# Patient Record
Sex: Male | Born: 1935 | Race: White | Hispanic: No | Marital: Married | Smoking: Current some day smoker
Health system: Southern US, Community
[De-identification: ages and names within clinical notes are randomized; demographics above are authoritative.]

## PROBLEM LIST (undated history)

## (undated) DIAGNOSIS — I10 Essential (primary) hypertension: Secondary | ICD-10-CM

## (undated) DIAGNOSIS — Z9289 Personal history of other medical treatment: Secondary | ICD-10-CM

## (undated) DIAGNOSIS — Z950 Presence of cardiac pacemaker: Secondary | ICD-10-CM

## (undated) DIAGNOSIS — I4891 Unspecified atrial fibrillation: Secondary | ICD-10-CM

## (undated) DIAGNOSIS — E785 Hyperlipidemia, unspecified: Secondary | ICD-10-CM

## (undated) DIAGNOSIS — I4430 Unspecified atrioventricular block: Secondary | ICD-10-CM

## (undated) DIAGNOSIS — B019 Varicella without complication: Secondary | ICD-10-CM

## (undated) DIAGNOSIS — C801 Malignant (primary) neoplasm, unspecified: Secondary | ICD-10-CM

## (undated) HISTORY — DX: Varicella without complication: B01.9

## (undated) HISTORY — DX: Hyperlipidemia, unspecified: E78.5

## (undated) HISTORY — DX: Essential (primary) hypertension: I10

## (undated) HISTORY — PX: HERNIA REPAIR: SHX51

## (undated) HISTORY — DX: Personal history of other medical treatment: Z92.89

## (undated) HISTORY — DX: Unspecified atrioventricular block: I44.30

## (undated) HISTORY — DX: Unspecified atrial fibrillation: I48.91

## (undated) HISTORY — PX: EYE SURGERY: SHX253

---

## 2001-05-08 ENCOUNTER — Encounter: Payer: Self-pay | Admitting: General Surgery

## 2001-05-15 ENCOUNTER — Inpatient Hospital Stay (HOSPITAL_COMMUNITY): Admission: RE | Admit: 2001-05-15 | Discharge: 2001-05-18 | Payer: Self-pay | Admitting: General Surgery

## 2005-05-05 ENCOUNTER — Ambulatory Visit: Payer: Self-pay | Admitting: Radiation Oncology

## 2005-05-24 ENCOUNTER — Ambulatory Visit: Payer: Self-pay | Admitting: Radiation Oncology

## 2005-08-21 ENCOUNTER — Emergency Department: Payer: Self-pay | Admitting: Emergency Medicine

## 2006-05-04 ENCOUNTER — Ambulatory Visit: Payer: Self-pay | Admitting: Radiation Oncology

## 2006-05-24 ENCOUNTER — Ambulatory Visit: Payer: Self-pay | Admitting: Radiation Oncology

## 2006-10-24 HISTORY — PX: PROSTATECTOMY: SHX69

## 2007-03-20 ENCOUNTER — Ambulatory Visit: Payer: Self-pay | Admitting: Gastroenterology

## 2007-05-25 ENCOUNTER — Ambulatory Visit: Payer: Self-pay | Admitting: Radiation Oncology

## 2007-06-07 ENCOUNTER — Ambulatory Visit: Payer: Self-pay | Admitting: Radiation Oncology

## 2007-06-25 ENCOUNTER — Ambulatory Visit: Payer: Self-pay | Admitting: Radiation Oncology

## 2008-05-24 ENCOUNTER — Ambulatory Visit: Payer: Self-pay | Admitting: Radiation Oncology

## 2008-06-02 ENCOUNTER — Ambulatory Visit: Payer: Self-pay | Admitting: Radiation Oncology

## 2008-06-24 ENCOUNTER — Ambulatory Visit: Payer: Self-pay | Admitting: Radiation Oncology

## 2008-10-24 HISTORY — PX: PACEMAKER PLACEMENT: SHX43

## 2008-11-24 DIAGNOSIS — C801 Malignant (primary) neoplasm, unspecified: Secondary | ICD-10-CM

## 2008-11-24 HISTORY — DX: Malignant (primary) neoplasm, unspecified: C80.1

## 2009-05-24 ENCOUNTER — Ambulatory Visit: Payer: Self-pay | Admitting: Radiation Oncology

## 2009-06-02 ENCOUNTER — Ambulatory Visit: Payer: Self-pay | Admitting: Cardiology

## 2009-06-03 ENCOUNTER — Ambulatory Visit: Payer: Self-pay | Admitting: Cardiology

## 2009-06-08 ENCOUNTER — Ambulatory Visit: Payer: Self-pay | Admitting: Radiation Oncology

## 2009-06-24 ENCOUNTER — Ambulatory Visit: Payer: Self-pay | Admitting: Radiation Oncology

## 2010-05-12 ENCOUNTER — Ambulatory Visit: Payer: Self-pay | Admitting: Gastroenterology

## 2010-05-24 ENCOUNTER — Ambulatory Visit: Payer: Self-pay | Admitting: Radiation Oncology

## 2010-06-09 ENCOUNTER — Ambulatory Visit: Payer: Self-pay | Admitting: Radiation Oncology

## 2010-06-10 LAB — PSA

## 2010-06-24 ENCOUNTER — Ambulatory Visit: Payer: Self-pay | Admitting: Radiation Oncology

## 2011-06-13 ENCOUNTER — Ambulatory Visit: Payer: Self-pay | Admitting: Radiation Oncology

## 2011-06-14 LAB — PSA: PSA: <0.1 ng/mL (ref 0.0–4.0)

## 2011-06-25 ENCOUNTER — Ambulatory Visit: Payer: Self-pay | Admitting: Radiation Oncology

## 2012-02-16 ENCOUNTER — Ambulatory Visit: Payer: Self-pay

## 2012-02-20 ENCOUNTER — Ambulatory Visit: Payer: Self-pay

## 2012-04-16 DIAGNOSIS — C61 Malignant neoplasm of prostate: Secondary | ICD-10-CM | POA: Insufficient documentation

## 2012-09-14 ENCOUNTER — Ambulatory Visit: Payer: Self-pay | Admitting: Specialist

## 2013-05-20 ENCOUNTER — Ambulatory Visit: Payer: Self-pay | Admitting: Family Medicine

## 2013-06-09 ENCOUNTER — Emergency Department: Payer: Self-pay | Admitting: Emergency Medicine

## 2013-06-09 LAB — URINALYSIS, COMPLETE
Bacteria: NONE SEEN
Bilirubin,UR: NEGATIVE
Blood: NEGATIVE
Glucose,UR: NEGATIVE mg/dL (ref 0–75)
Ketone: NEGATIVE
Leukocyte Esterase: NEGATIVE
Nitrite: NEGATIVE
Ph: 7 (ref 4.5–8.0)
Protein: NEGATIVE
RBC,UR: NONE SEEN /HPF (ref 0–5)
Specific Gravity: 1.016 (ref 1.003–1.030)
Squamous Epithelial: NONE SEEN
WBC UR: NONE SEEN /HPF (ref 0–5)

## 2013-06-09 LAB — COMPREHENSIVE METABOLIC PANEL
Albumin: 4.1 g/dL (ref 3.4–5.0)
Alkaline Phosphatase: 65 U/L (ref 50–136)
Anion Gap: 4 — ABNORMAL LOW (ref 7–16)
BUN: 14 mg/dL (ref 7–18)
Bilirubin,Total: 0.7 mg/dL (ref 0.2–1.0)
Calcium, Total: 9.4 mg/dL (ref 8.5–10.1)
Chloride: 98 mmol/L (ref 98–107)
Co2: 24 mmol/L (ref 21–32)
Creatinine: 1 mg/dL (ref 0.60–1.30)
EGFR (African American): >60
EGFR (Non-African Amer.): >60
Glucose: 97 mg/dL (ref 65–99)
Osmolality: 254 (ref 275–301)
Potassium: 4.9 mmol/L (ref 3.5–5.1)
SGOT(AST): 25 U/L (ref 15–37)
SGPT (ALT): 28 U/L (ref 12–78)
Sodium: 126 mmol/L — ABNORMAL LOW (ref 136–145)
Total Protein: 7.9 g/dL (ref 6.4–8.2)

## 2013-06-09 LAB — CBC
HCT: 41.4 % (ref 40.0–52.0)
HGB: 14.5 g/dL (ref 13.0–18.0)
MCH: 31.7 pg (ref 26.0–34.0)
MCHC: 35 g/dL (ref 32.0–36.0)
MCV: 91 fL (ref 80–100)
Platelet: 329 10*3/uL (ref 150–440)
RBC: 4.56 10*6/uL (ref 4.40–5.90)
RDW: 14 % (ref 11.5–14.5)
WBC: 10.5 10*3/uL (ref 3.8–10.6)

## 2013-11-11 ENCOUNTER — Ambulatory Visit: Payer: Self-pay | Admitting: Family Medicine

## 2014-02-10 ENCOUNTER — Ambulatory Visit: Payer: Self-pay | Admitting: Gastroenterology

## 2014-06-02 ENCOUNTER — Ambulatory Visit: Payer: Self-pay | Admitting: General Practice

## 2014-06-17 ENCOUNTER — Ambulatory Visit: Payer: Self-pay | Admitting: General Practice

## 2014-06-17 LAB — CBC WITH DIFFERENTIAL/PLATELET
Basophil #: 0.1 10*3/uL (ref 0.0–0.1)
Basophil %: 1.1 %
Eosinophil #: 0.7 10*3/uL (ref 0.0–0.7)
Eosinophil %: 6.9 %
HCT: 43.6 % (ref 40.0–52.0)
HGB: 14.7 g/dL (ref 13.0–18.0)
Lymphocyte #: 2.4 10*3/uL (ref 1.0–3.6)
Lymphocyte %: 22.3 %
MCH: 32 pg (ref 26.0–34.0)
MCHC: 33.6 g/dL (ref 32.0–36.0)
MCV: 95 fL (ref 80–100)
Monocyte #: 1 x10 3/mm (ref 0.2–1.0)
Monocyte %: 9.5 %
Neutrophil #: 6.4 10*3/uL (ref 1.4–6.5)
Neutrophil %: 60.2 %
Platelet: 270 10*3/uL (ref 150–440)
RBC: 4.59 10*6/uL (ref 4.40–5.90)
RDW: 13.5 % (ref 11.5–14.5)
WBC: 10.7 10*3/uL — ABNORMAL HIGH (ref 3.8–10.6)

## 2014-07-02 ENCOUNTER — Ambulatory Visit: Payer: Self-pay | Admitting: General Practice

## 2014-10-06 DIAGNOSIS — I340 Nonrheumatic mitral (valve) insufficiency: Secondary | ICD-10-CM | POA: Insufficient documentation

## 2014-10-13 DIAGNOSIS — Z8546 Personal history of malignant neoplasm of prostate: Secondary | ICD-10-CM | POA: Insufficient documentation

## 2014-10-13 DIAGNOSIS — I480 Paroxysmal atrial fibrillation: Secondary | ICD-10-CM | POA: Insufficient documentation

## 2014-10-24 HISTORY — PX: KNEE ARTHROSCOPY: SUR90

## 2014-11-03 DIAGNOSIS — Z85828 Personal history of other malignant neoplasm of skin: Secondary | ICD-10-CM | POA: Insufficient documentation

## 2015-02-14 NOTE — Op Note (Signed)
PATIENT NAME:  Gary [name], Gary [name] MR#:  [id number] DATE OF BIRTH:  Aug 05, 1930  DATE OF PROCEDURE:  07/02/2014  PREOPERATIVE DIAGNOSIS:  Internal derangement of the left knee.   POSTOPERATIVE DIAGNOSES:  1.  Tear of the anterior horn of the medial meniscus, left knee.  2.  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   PROCEDURES PERFORMED:  Left knee arthroscopy, partial medial meniscectomy and chondroplasty of the patellofemoral articulation.   SURGEON: Laurice Record. Hooten, MD.  ANESTHESIA: General.   ESTIMATED BLOOD LOSS: Minimal.   DRAINS: None.   TOURNIQUET TIME: Not used.   INDICATIONS FOR SURGERY: The patient is an 81 year old male who has been seen for complaints of left knee pain with occasional swelling. MRI cannot be obtained due to the patient's pacemaker. X-rays actually demonstrated a reasonably good articular space. After discussion of the risks and benefits of surgical intervention, the patient expressed understanding of the risks and benefits, and agreed with plans for surgical intervention.   PROCEDURE IN DETAIL:  The patient was brought into the operating room and after adequate general anesthesia was achieved a tourniquet was placed on the patient's left thigh and the leg was placed in a leg holder. All bony prominences were well padded. The patient's left knee and leg were cleaned and prepped with alcohol and DuraPrep and draped in the usual sterile fashion. A "timeout" was performed as per usual protocol. The anticipated portal sites were injected with 0.25% Marcaine with epinephrine. An anterolateral portal was created and a cannula was inserted. The scope was inserted and the knee was distended with fluid using the pump. The scope was advanced down the medial gutter into the medial compartment of the knee. Under visualization with the scope, an anteromedial portal was created and hook probe was inserted.   Inspection of the articular cartilage showed the  surface to be in good condition without significant degenerative changes. The posterior horn of the medial meniscus was visualized and only mild fraying was noted along the inner aspect. These areas were debrided using the ArthroCare wand. Inspection of the anterior horn did demonstrate an area with a radial tear. The area of the tear was debrided using a 4.5-mm shaver with contouring performed using the ArthroCare wand. The remaining rim was visualized and probed and felt to be stable.   The scope was then positioned so as to visualize the ACL.  The ACL was probed and felt to be stable. The scope was removed from the anterolateral portal and reinserted via the anteromedial portal so as to better visualize the lateral compartment. There was some mild fraying along the innermost aspect of the meniscus and these areas were debrided using the 50-degree ArthroCare wand. The remaining portion of the meniscus was visualized and probed and felt to be stable.   The articular surface was in good condition. Finally, the scope was positioned so as to visualize the patellofemoral articulation. Good patellar tracking was noted. There was softening and fraying of the articular surface within the intercondylar groove consistent with grade 3 changes of chondromalacia. These areas were debrided and contoured using the 50-degree ArthroCare wand.   The knee was then irrigated with copious amounts of fluid and suctioned dry. The anterolateral portal was reapproximated using 3-0 nylon. A combination of 0.25% Marcaine with epinephrine and 4 mg of morphine was injected via the scope. The scope was removed and the anteromedial portal was reapproximated using 3-0 nylon. A sterile dressing was applied followed by application of ice wrap.  The patient tolerated the procedure well. He was transported to the recovery room in stable condition.    ____________________________ Laurice Record. Holley Bouche., MD jph:lt D: 07/03/2014 35:24:81  ET T: 07/03/2014 07:06:17 ET JOB#: [id number]  cc: Laurice Record. Holley Bouche., MD, <Dictator> JAMES P Holley Bouche MD ELECTRONICALLY SIGNED 07/03/2014 20:41

## 2015-04-08 ENCOUNTER — Ambulatory Visit (INDEPENDENT_AMBULATORY_CARE_PROVIDER_SITE_OTHER): Payer: Medicare Other | Admitting: Family Medicine

## 2015-04-08 ENCOUNTER — Encounter: Payer: Self-pay | Admitting: Family Medicine

## 2015-04-08 VITALS — BP 140/80 | HR 79 | Resp 16 | Ht 73.0 in | Wt 232.0 lb

## 2015-04-08 DIAGNOSIS — I480 Paroxysmal atrial fibrillation: Secondary | ICD-10-CM | POA: Insufficient documentation

## 2015-04-08 DIAGNOSIS — L309 Dermatitis, unspecified: Secondary | ICD-10-CM

## 2015-04-08 DIAGNOSIS — B372 Candidiasis of skin and nail: Secondary | ICD-10-CM | POA: Insufficient documentation

## 2015-04-08 DIAGNOSIS — I10 Essential (primary) hypertension: Secondary | ICD-10-CM | POA: Insufficient documentation

## 2015-04-08 NOTE — Progress Notes (Signed)
Name: Gary [name]   MRN: [id number]    DOB: July 05, 1930   Date:04/08/2015       Progress Note  Subjective  Chief Complaint  Chief Complaint  Patient presents with  . Follow-up    2 month follow up   . Rash    Patient reports that he has had rash for over a 1 year and been to 4 dermatologist.     Rash This is a chronic problem. The current episode started more than 1 year ago (present for over 2 years and he has seen multiple dermatologists without any relief of symptoms). The problem is unchanged. The affected locations include the groin. The rash is characterized by burning, itchiness and scaling. Associated symptoms include fatigue and joint pain (has arthritis of joints.). Pertinent negatives include no fever or vomiting. Treatments tried: has tried both OTC and Rx creams in the past with no relief. The treatment provided no relief.      Past Medical History  Diagnosis Date  . Hypertension   . Hyperlipidemia     Past Surgical History  Procedure Laterality Date  . Prostatectomy    . Hernia repair      Family History  Problem Relation Age of Onset  . Cancer Father   . Heart disease Father   . Cancer Brother     History   Social History  . Marital Status: Married    Spouse Name: N/A  . Number of Children: N/A  . Years of Education: N/A   Occupational History  . Not on file.   Social History Main Topics  . Smoking status: Current Some Day Smoker -- 0.25 packs/day for 40 years    Types: Cigarettes  . Smokeless tobacco: Not on file  . Alcohol Use: Yes  . Drug Use: No  . Sexual Activity: Not on file   Other Topics Concern  . Not on file   Social History Narrative  . No narrative on file     Current outpatient prescriptions:  .  apixaban (ELIQUIS) 2.5 MG TABS tablet, Take 1 tablet by mouth daily., Disp: , Rfl:  .  fenofibrate 160 MG tablet, Take 1 tablet by mouth daily., Disp: , Rfl:  .  metoprolol succinate (TOPROL-XL) 25 MG 24 hr tablet, Take 1 tablet  by mouth daily., Disp: , Rfl:  .  simvastatin (ZOCOR) 20 MG tablet, Take 1 tablet by mouth daily., Disp: , Rfl:   No Known Allergies   Review of Systems  Constitutional: Positive for fatigue. Negative for fever.  Gastrointestinal: Negative for vomiting.  Musculoskeletal: Positive for joint pain (has arthritis of joints.).  Skin: Positive for itching and rash.      Objective  Filed Vitals:   04/08/15 1446  BP: 140/80  Pulse: 79  Resp: 16  Height: '6\' 1"'$  (1.854 m)  Weight: 232 lb (105.235 kg)  SpO2: 95%    Physical Exam  Constitutional: He is well-developed, well-nourished, and in no distress.  Skin:     Mildly erythematous, macular, pruritic rash over the left and right groin  Nursing note and vitals reviewed.      No results found for this or any previous visit (from the past 2160 hour(s)).   Assessment & Plan 1. Chronic dermatitis In terms consistent with chronic dermatitis. I have recommended Aveeno hydrocortisone anti-itch cream to be applied twice daily for 7 days. Patient is to allow the skin to air dry after application. If rash fails to improve, he will need  a referral to dermatology. Patient verbalized understanding with the plan.  There are no diagnoses linked to this encounter.  Gary [name] Asad A. [facility] 04/08/2015 2:55 PM

## 2015-04-13 ENCOUNTER — Ambulatory Visit: Payer: Self-pay | Admitting: Family Medicine

## 2015-05-08 ENCOUNTER — Encounter: Payer: Self-pay | Admitting: Family Medicine

## 2015-05-08 ENCOUNTER — Ambulatory Visit (INDEPENDENT_AMBULATORY_CARE_PROVIDER_SITE_OTHER): Payer: Medicare Other | Admitting: Family Medicine

## 2015-05-08 VITALS — BP 144/70 | HR 78 | Temp 98.7°F | Resp 19 | Ht 73.0 in | Wt 229.8 lb

## 2015-05-08 DIAGNOSIS — I459 Conduction disorder, unspecified: Secondary | ICD-10-CM | POA: Insufficient documentation

## 2015-05-08 DIAGNOSIS — L309 Dermatitis, unspecified: Secondary | ICD-10-CM

## 2015-05-08 DIAGNOSIS — Z8546 Personal history of malignant neoplasm of prostate: Secondary | ICD-10-CM | POA: Insufficient documentation

## 2015-05-08 DIAGNOSIS — R300 Dysuria: Secondary | ICD-10-CM | POA: Diagnosis not present

## 2015-05-08 NOTE — Progress Notes (Signed)
Name: Gary [name]   MRN: [id number]    DOB: Aug 10, 1930   Date:05/08/2015       Progress Note  Subjective  Chief Complaint  Chief Complaint  Patient presents with  . Follow-up    3 mo. rash in groin/ patient thinks he may have urinary infection    Dysuria  This is a recurrent problem. The current episode started 1 to 4 weeks ago. The problem occurs intermittently. The quality of the pain is described as burning. Associated symptoms include frequency and urgency. Pertinent negatives include no chills, flank pain, hematuria, nausea, sweats or vomiting. He has tried nothing for the symptoms. His past medical history is significant for a urological procedure (surgery for removal of prostate over 10 years ago.). There is no history of kidney stones or recurrent UTIs.  Groin Rash Patient is here for follow-up of a chronic rash in his groin area. It has been present for a long time and has been seen by multiple dermatologists. According to patient, the dermatologists have prescribed different creams but none have been able to relieve the rash. He was asked to use Aveeno hydrocortisone anti-itch cream at his appointment last month.   Past Medical History  Diagnosis Date  . Hypertension   . Hyperlipidemia     Past Surgical History  Procedure Laterality Date  . Prostatectomy    . Hernia repair      Family History  Problem Relation Age of Onset  . Cancer Father   . Heart disease Father   . Cancer Brother     History   Social History  . Marital Status: Married    Spouse Name: N/A  . Number of Children: N/A  . Years of Education: N/A   Occupational History  . Not on file.   Social History Main Topics  . Smoking status: Current Some Day Smoker -- 0.25 packs/day for 40 years    Types: Cigarettes  . Smokeless tobacco: Not on file  . Alcohol Use: Yes  . Drug Use: No  . Sexual Activity: Not on file   Other Topics Concern  . Not on file   Social History Narrative      Current outpatient prescriptions:  .  apixaban (ELIQUIS) 2.5 MG TABS tablet, Take 1 tablet by mouth daily., Disp: , Rfl:  .  fenofibrate 160 MG tablet, Take 1 tablet by mouth daily., Disp: , Rfl:  .  metoprolol succinate (TOPROL-XL) 25 MG 24 hr tablet, Take 1 tablet by mouth daily., Disp: , Rfl:  .  simvastatin (ZOCOR) 20 MG tablet, Take 1 tablet by mouth daily., Disp: , Rfl:   No Known Allergies   Review of Systems  Constitutional: Negative for fever and chills.  Gastrointestinal: Negative for nausea and vomiting.  Genitourinary: Positive for dysuria, urgency and frequency. Negative for hematuria and flank pain.  Skin: Positive for rash. Negative for itching.      Objective  Filed Vitals:   05/08/15 1131  BP: 144/70  Pulse: 78  Temp: 98.7 F (37.1 C)  TempSrc: Oral  Resp: 19  Height: '6\' 1"'$  (1.854 m)  Weight: 229 lb 12.8 oz (104.237 kg)  SpO2: 96%    Physical Exam  Constitutional: He is oriented to person, place, and time and well-developed, well-nourished, and in no distress.  HENT:  Head: Normocephalic and atraumatic.  Cardiovascular: Normal rate and regular rhythm.   Pulmonary/Chest: Effort normal and breath sounds normal.  Abdominal: Soft. Bowel sounds are normal.  Neurological: He is  alert and oriented to person, place, and time.  Skin: Skin is warm and dry. No rash noted. No erythema.  Nursing note and vitals reviewed.    Assessment & Plan 1. Dysuria We will obtain urinalysis and lab work for evaluation of symptoms of dysuria. Follow-up after review of lab results. - Urinalysis, Routine w reflex microscopic - Comprehensive Metabolic Panel (CMET) - CBC with Differential  2. Chronic dermatitis The rash in his groin appears to have resolved. Patient was reassured. He may use Aveeno hydrocortisone anti-itch cream as needed.   Gary [name] Asad A. [facility] 05/08/2015 6:26 PM

## 2015-05-09 LAB — URINALYSIS, ROUTINE W REFLEX MICROSCOPIC
BILIRUBIN UA: NEGATIVE
Glucose, UA: NEGATIVE
Ketones, UA: NEGATIVE
LEUKOCYTES UA: NEGATIVE
Nitrite, UA: NEGATIVE
Protein, UA: NEGATIVE
RBC, UA: NEGATIVE
SPEC GRAV UA: 1.018 (ref 1.005–1.030)
Urobilinogen, Ur: 1 mg/dL (ref 0.2–1.0)
pH, UA: 6.5 (ref 5.0–7.5)

## 2015-05-09 LAB — CBC WITH DIFFERENTIAL/PLATELET
BASOS ABS: 0.1 10*3/uL (ref 0.0–0.2)
Basos: 1 %
EOS (ABSOLUTE): 0.8 10*3/uL — ABNORMAL HIGH (ref 0.0–0.4)
EOS: 7 %
Hematocrit: 46.2 % (ref 37.5–51.0)
Hemoglobin: 15.7 g/dL (ref 12.6–17.7)
IMMATURE GRANS (ABS): 0 10*3/uL (ref 0.0–0.1)
IMMATURE GRANULOCYTES: 0 %
LYMPHS: 26 %
Lymphocytes Absolute: 2.8 10*3/uL (ref 0.7–3.1)
MCH: 31.6 pg (ref 26.6–33.0)
MCHC: 34 g/dL (ref 31.5–35.7)
MCV: 93 fL (ref 79–97)
MONOCYTES: 8 %
Monocytes Absolute: 0.9 10*3/uL (ref 0.1–0.9)
NEUTROS PCT: 58 %
Neutrophils Absolute: 6.4 10*3/uL (ref 1.4–7.0)
PLATELETS: 287 10*3/uL (ref 150–379)
RBC: 4.97 x10E6/uL (ref 4.14–5.80)
RDW: 13.9 % (ref 12.3–15.4)
WBC: 11 10*3/uL — AB (ref 3.4–10.8)

## 2015-05-09 LAB — COMPREHENSIVE METABOLIC PANEL
ALK PHOS: 62 IU/L (ref 39–117)
ALT: 19 IU/L (ref 0–44)
AST: 21 IU/L (ref 0–40)
Albumin/Globulin Ratio: 1.6 (ref 1.1–2.5)
Albumin: 4.4 g/dL (ref 3.5–4.7)
BUN/Creatinine Ratio: 18 (ref 10–22)
BUN: 15 mg/dL (ref 8–27)
Bilirubin Total: 0.7 mg/dL (ref 0.0–1.2)
CHLORIDE: 97 mmol/L (ref 97–108)
CO2: 26 mmol/L (ref 18–29)
Calcium: 10.1 mg/dL (ref 8.6–10.2)
Creatinine, Ser: 0.84 mg/dL (ref 0.76–1.27)
GFR calc Af Amer: 94 mL/min/{1.73_m2} (ref >59)
GFR calc non Af Amer: 81 mL/min/{1.73_m2} (ref >59)
GLUCOSE: 90 mg/dL (ref 65–99)
Globulin, Total: 2.8 g/dL (ref 1.5–4.5)
POTASSIUM: 5.4 mmol/L — AB (ref 3.5–5.2)
Sodium: 136 mmol/L (ref 134–144)
TOTAL PROTEIN: 7.2 g/dL (ref 6.0–8.5)

## 2015-05-18 ENCOUNTER — Ambulatory Visit: Payer: Medicare Other

## 2015-07-23 ENCOUNTER — Encounter: Payer: Self-pay | Admitting: Family Medicine

## 2015-07-23 ENCOUNTER — Ambulatory Visit (INDEPENDENT_AMBULATORY_CARE_PROVIDER_SITE_OTHER): Payer: 59 | Admitting: Family Medicine

## 2015-07-23 VITALS — BP 124/78 | HR 64 | Temp 97.7°F | Ht 72.0 in | Wt 230.1 lb

## 2015-07-23 DIAGNOSIS — I10 Essential (primary) hypertension: Secondary | ICD-10-CM

## 2015-07-23 DIAGNOSIS — I442 Atrioventricular block, complete: Secondary | ICD-10-CM

## 2015-07-23 DIAGNOSIS — E66811 Obesity, class 1: Secondary | ICD-10-CM

## 2015-07-23 DIAGNOSIS — R0989 Other specified symptoms and signs involving the circulatory and respiratory systems: Secondary | ICD-10-CM

## 2015-07-23 DIAGNOSIS — R21 Rash and other nonspecific skin eruption: Secondary | ICD-10-CM

## 2015-07-23 DIAGNOSIS — Z720 Tobacco use: Secondary | ICD-10-CM

## 2015-07-23 DIAGNOSIS — I480 Paroxysmal atrial fibrillation: Secondary | ICD-10-CM

## 2015-07-23 DIAGNOSIS — E785 Hyperlipidemia, unspecified: Secondary | ICD-10-CM

## 2015-07-23 DIAGNOSIS — O1002 Pre-existing essential hypertension complicating childbirth: Secondary | ICD-10-CM | POA: Insufficient documentation

## 2015-07-23 DIAGNOSIS — Z23 Encounter for immunization: Secondary | ICD-10-CM

## 2015-07-23 DIAGNOSIS — E669 Obesity, unspecified: Secondary | ICD-10-CM

## 2015-07-23 DIAGNOSIS — Z418 Encounter for other procedures for purposes other than remedying health state: Secondary | ICD-10-CM

## 2015-07-23 DIAGNOSIS — Z299 Encounter for prophylactic measures, unspecified: Secondary | ICD-10-CM

## 2015-07-23 LAB — LIPID PANEL
CHOL/HDL RATIO: 3
Cholesterol: 177 mg/dL (ref 0–200)
HDL: 51.3 mg/dL (ref >39.00)
LDL Cholesterol: 97 mg/dL (ref 0–99)
NonHDL: 126.14
Triglycerides: 146 mg/dL (ref 0.0–149.0)
VLDL: 29.2 mg/dL (ref 0.0–40.0)

## 2015-07-23 LAB — HEMOGLOBIN A1C: Hgb A1c MFr Bld: 5.8 % (ref 4.6–6.5)

## 2015-07-23 MED ORDER — BUPROPION HCL ER (SR) 150 MG PO TB12: ORAL_TABLET | ORAL | Status: DC

## 2015-07-23 NOTE — Progress Notes (Signed)
Pre visit review using our clinic review tool, if applicable. No additional management support is needed unless otherwise documented below in the visit note. 

## 2015-07-23 NOTE — Patient Instructions (Signed)
It was nice to see you today.  Continue your current medications.  Take the wellbutrin as prescribed.  Be sure to pick a quit date!  Follow up in ~ 3 months.  We will call with your labs and your appointment for the blood pressures in your legs.  Take care  Dr. Lacinda Axon

## 2015-07-24 ENCOUNTER — Other Ambulatory Visit: Payer: Self-pay | Admitting: Family Medicine

## 2015-07-24 ENCOUNTER — Encounter: Payer: Self-pay | Admitting: Family Medicine

## 2015-07-24 DIAGNOSIS — I442 Atrioventricular block, complete: Secondary | ICD-10-CM | POA: Insufficient documentation

## 2015-07-24 DIAGNOSIS — I10 Essential (primary) hypertension: Secondary | ICD-10-CM | POA: Insufficient documentation

## 2015-07-24 DIAGNOSIS — R21 Rash and other nonspecific skin eruption: Secondary | ICD-10-CM | POA: Insufficient documentation

## 2015-07-24 DIAGNOSIS — R0989 Other specified symptoms and signs involving the circulatory and respiratory systems: Secondary | ICD-10-CM | POA: Insufficient documentation

## 2015-07-24 DIAGNOSIS — Z720 Tobacco use: Secondary | ICD-10-CM | POA: Insufficient documentation

## 2015-07-24 MED ORDER — ATORVASTATIN CALCIUM 40 MG PO TABS: 40.0000 mg | ORAL_TABLET | Freq: Every day | ORAL | Status: DC

## 2015-07-24 NOTE — Assessment & Plan Note (Signed)
Noted on exam today. Patient at high risk for peripheral vascular disease given smoking history. Sending to vascular for ABIs.

## 2015-07-24 NOTE — Assessment & Plan Note (Signed)
Well controlled. Continue metoprolol.

## 2015-07-24 NOTE — Assessment & Plan Note (Signed)
Unclear etiology. Offered dermatology referral and he declined. Will continue to monitor closely.

## 2015-07-24 NOTE — Assessment & Plan Note (Signed)
Lipid panel was obtained and revealed LDL of 97. Will notify patient of results and discuss potential switch from simvastatin to atorvastatin.

## 2015-07-24 NOTE — Assessment & Plan Note (Signed)
Doing well on Xarelto.

## 2015-07-24 NOTE — Progress Notes (Signed)
Subjective:  Patient ID: Gary [name], male    DOB: [birth year]  Age: 81 y.o. MRN: [id number]  CC: Establish care.  HPI Gary [name] is a 81 y.o. male presents to the clinic today to establish care. Current issues are below.  1) HTN  Well controlled.  Medications - Metoprolol.  Compliance -  Yes.  ROS: Denies chest pain, SOB, lightheadedness/dizziness   2) HLD  In need of lipid panel.  Per cardiology, has been stable and well controlled.   3) A fib  Doing well on Xarelto.  4) Rash  Patient reports he has had a rash as growing for approximately 1 year.  He states that he seen several physicians including dermatologist with no resolution.  He reports the rash is mildly troublesome and itches intermittently.  No exacerbating or relieving factors.  PMH, Surgical Hx, Family Hx, Social History reviewed and updated as below. Past Medical History  Diagnosis Date  . Hypertension   . Hyperlipidemia   . Chicken pox   . History of blood transfusion   . AV block     s/p Pacemaker placement.  . Atrial fibrillation    Past Surgical History  Procedure Laterality Date  . Prostatectomy    . Hernia repair    . Knee arthroscopy    . Pacemaker placement     Family History  Problem Relation Age of Onset  . Cancer Father   . Heart disease Father   . Cancer Brother    Social History  Substance Use Topics  . Smoking status: Current Some Day Smoker -- 0.25 packs/day for 40 years    Types: Cigarettes  . Smokeless tobacco: Never Used  . Alcohol Use: 1.2 oz/week    2 Standard drinks or equivalent per week   Review of Systems  Skin: Positive for rash.       Groin rash.  All other systems negative.  Objective:   Today's Vitals: BP 124/78 mmHg  Pulse 64  Temp(Src) 97.7 F (36.5 C) (Oral)  Ht 6' (1.829 m)  Wt 230 lb 2 oz (104.384 kg)  BMI 31.20 kg/m2  SpO2 94%  Physical Exam  Constitutional: He appears well-developed and well-nourished. No distress.  HENT:    Head: Normocephalic and atraumatic.  Mouth/Throat: Oropharynx is clear and moist.  Normal TM's bilaterally.  Eyes: Conjunctivae are normal. No scleral icterus.  Neck: Neck supple. No thyromegaly present.  Cardiovascular: Normal rate and regular rhythm.   2/6 systolic murmur noted.  Decreased pulses of the lower extremities.  Pulmonary/Chest: Effort normal and breath sounds normal. No respiratory distress. He has no wheezes. He has no rales.  Abdominal: Soft. He exhibits no distension. There is no tenderness. There is no rebound and no guarding.  Lymphadenopathy:    He has no cervical adenopathy.  Neurological: He is alert.  Skin:  Petechial rash noted on the scrotum.  Psychiatric: He has a normal mood and affect.  Vitals reviewed.  Assessment & Plan:   Problem List Items Addressed This Visit    AF (paroxysmal atrial fibrillation)    Doing well on Xarelto.      Relevant Medications   aspirin 81 MG tablet   Hyperlipidemia - Primary    Lipid panel was obtained and revealed LDL of 97. Will notify patient of results and discuss potential switch from simvastatin to atorvastatin.      Relevant Medications   aspirin 81 MG tablet   Other Relevant Orders   Lipid panel (Completed)  AV block, complete   Relevant Medications   aspirin 81 MG tablet   Tobacco abuse    We discussed tobacco cessation today and he would like to quit. Starting Wellbutrin.      HTN (hypertension)    Well controlled. Continue metoprolol.      Relevant Medications   aspirin 81 MG tablet   Rash    Unclear etiology. Offered dermatology referral and he declined. Will continue to monitor closely.      Diminished pulses in lower extremity    Noted on exam today. Patient at high risk for peripheral vascular disease given smoking history. Sending to vascular for ABIs.      Relevant Orders   Ambulatory referral to Vascular Surgery    Other Visit Diagnoses    Obesity (BMI 30.0-34.9)         Relevant Orders    Hemoglobin A1c (Completed)    Need for prophylactic vaccination with combined diphtheria-tetanus-pertussis (DTP) vaccine        Relevant Orders    Tdap vaccine greater than or equal to 7yo IM (Completed)    Need for prophylactic measure        Relevant Orders    Pneumococcal conjugate vaccine 13-valent (Completed)       Outpatient Encounter Prescriptions as of 07/23/2015  Medication Sig  . apixaban (ELIQUIS) 2.5 MG TABS tablet Take 1 tablet by mouth 2 (two) times daily.   Marland Kitchen aspirin 81 MG tablet Take 81 mg by mouth daily.  . fenofibrate 160 MG tablet Take 1 tablet by mouth daily.  . metoprolol succinate (TOPROL-XL) 25 MG 24 hr tablet Take 1 tablet by mouth daily.  . simvastatin (ZOCOR) 20 MG tablet Take 1 tablet by mouth daily.  Marland Kitchen buPROPion (WELLBUTRIN SR) 150 MG 12 hr tablet 150 mg once daily for 3 days. Then increase to 150 mg twice daily.   No facility-administered encounter medications on file as of 07/23/2015.    Follow-up:   Coral Spikes DO

## 2015-07-24 NOTE — Assessment & Plan Note (Signed)
We discussed tobacco cessation today and he would like to quit. Starting Wellbutrin.

## 2015-10-29 ENCOUNTER — Ambulatory Visit: Payer: Self-pay | Admitting: Family Medicine

## 2015-11-06 ENCOUNTER — Encounter: Payer: Self-pay | Admitting: Family Medicine

## 2015-11-06 ENCOUNTER — Ambulatory Visit (INDEPENDENT_AMBULATORY_CARE_PROVIDER_SITE_OTHER): Payer: Medicare Other | Admitting: Family Medicine

## 2015-11-06 VITALS — BP 126/64 | HR 81 | Temp 97.6°F | Ht 72.0 in | Wt 233.5 lb

## 2015-11-06 DIAGNOSIS — H6122 Impacted cerumen, left ear: Secondary | ICD-10-CM | POA: Diagnosis not present

## 2015-11-06 DIAGNOSIS — R21 Rash and other nonspecific skin eruption: Secondary | ICD-10-CM

## 2015-11-06 DIAGNOSIS — Z720 Tobacco use: Secondary | ICD-10-CM

## 2015-11-06 MED ORDER — CLOTRIMAZOLE-BETAMETHASONE 1-0.05 % EX CREA: 1.0000 "application " | TOPICAL_CREAM | Freq: Two times a day (BID) | CUTANEOUS | Status: DC

## 2015-11-06 NOTE — Progress Notes (Signed)
Pre visit review using our clinic review tool, if applicable. No additional management support is needed unless otherwise documented below in the visit note. 

## 2015-11-06 NOTE — Patient Instructions (Signed)
Start the Wellbutrin. It will help your mood and help you quit smoking.  Use the topical agent for your rash. Consider seeing dermatology.  Follow up in 3-6 months.  Take care  Dr. Lacinda Axon

## 2015-11-09 DIAGNOSIS — H6120 Impacted cerumen, unspecified ear: Secondary | ICD-10-CM | POA: Insufficient documentation

## 2015-11-09 NOTE — Assessment & Plan Note (Signed)
Patient has not started Wellbutrin as we discussed. Advised him to start as this will aid with smoking cessation as well and mood (given recent PHQ-9 of 6).

## 2015-11-09 NOTE — Progress Notes (Signed)
Subjective:  Patient ID: Gary [name], male    DOB: 1930/10/04  Age: 82 y.o. MRN: [id number]  CC: Follow up rash, ? Depression, Cerumen impaction  HPI:  82 year old male presents to clinic today for follow-up of the above issues.  Rash  Patient continues to report difficulty with groin rash.  He reinterates that he has seen several doctors for this including Dermatologists with no improvement.  He states that it is pruritic.  He reports improvement with OTC Hydrocortisone (But no resolution).  No known exacerbating factors.  ? Depression  Patient was recently seen by an RN at home via [facility] calls.  She screened him for depression - PHQ-9 was 6.  RN concerned about depression.  Patient states that he is intermittently down but does not feel depressed.  Cerumen impaction  Noted recently by RN (see above).  Left ear was impacted.  He reports decreased hearing in that ear.  Social Hx   Social History   Social History  . Marital Status: Married    Spouse Name: N/A  . Number of Children: N/A  . Years of Education: N/A   Social History Main Topics  . Smoking status: Current Some Day Smoker -- 0.25 packs/day for 40 years    Types: Cigarettes  . Smokeless tobacco: Never Used  . Alcohol Use: 1.2 oz/week    2 Standard drinks or equivalent per week  . Drug Use: No  . Sexual Activity: Not Asked   Other Topics Concern  . None   Social History Narrative   Review of Systems  HENT: Positive for hearing loss.   Skin: Positive for rash.    Objective:  BP 126/64 mmHg  Pulse 81  Temp(Src) 97.6 F (36.4 C) (Oral)  Ht 6' (1.829 m)  Wt 233 lb 8 oz (105.915 kg)  BMI 31.66 kg/m2  SpO2 97%  BP/Weight 11/06/2015 07/23/2015 9/37/1696  Systolic BP 789 381 017  Diastolic BP 64 78 70  Wt. (Lbs) 233.5 230.13 229.8  BMI 31.66 31.2 30.33   Physical Exam  Constitutional: He appears well-developed. No distress.  HENT:  Head: Normocephalic and atraumatic.  R TM  normal. Left TM obscured by cerumen.  Pulmonary/Chest: Effort normal.  Neurological: He is alert.  Skin:  Groin - bilaterally erythema noted. Papular areas noted in the groin as well. Appear like condyloma.  Psychiatric: He has a normal mood and affect.  Vitals reviewed.   Lab Results  Component Value Date   WBC 11.0* 05/08/2015   HGB 14.7 06/17/2014   HCT 46.2 05/08/2015   PLT 287 05/08/2015   GLUCOSE 90 05/08/2015   CHOL 177 07/23/2015   TRIG 146.0 07/23/2015   HDL 51.30 07/23/2015   LDLCALC 97 07/23/2015   ALT 19 05/08/2015   AST 21 05/08/2015   NA 136 05/08/2015   K 5.4* 05/08/2015   CL 97 05/08/2015   CREATININE 0.84 05/08/2015   BUN 15 05/08/2015   CO2 26 05/08/2015   PSA <0.1 06/13/2011   HGBA1C 5.8 07/23/2015    Assessment & Plan:   Problem List Items Addressed This Visit    Tobacco abuse    Patient has not started Wellbutrin as we discussed. Advised him to start as this will aid with smoking cessation as well and mood (given recent PHQ-9 of 6).      Rash - Primary    ? Yeast. Treating with Lotrisone.      Cerumen impaction    New problem.  Left ear irrigated with removal of impaction.         Meds ordered this encounter  Medications  . clotrimazole-betamethasone (LOTRISONE) cream    Sig: Apply 1 application topically 2 (two) times daily.    Dispense:  15 g    Refill:  0    Follow-up: 3 months  [place] DO [facility]

## 2015-11-09 NOTE — Assessment & Plan Note (Signed)
New problem.  Left ear irrigated with removal of impaction.

## 2015-11-09 NOTE — Assessment & Plan Note (Signed)
?   Yeast. Treating with Lotrisone.

## 2016-03-18 ENCOUNTER — Ambulatory Visit (INDEPENDENT_AMBULATORY_CARE_PROVIDER_SITE_OTHER): Payer: Medicare Other

## 2016-03-18 VITALS — BP 124/72 | HR 72 | Temp 98.1°F | Resp 12 | Ht 72.0 in | Wt 227.4 lb

## 2016-03-18 DIAGNOSIS — Z0000 Encounter for general adult medical examination without abnormal findings: Secondary | ICD-10-CM

## 2016-03-18 NOTE — Patient Instructions (Addendum)
Mr. Gary [name] , Thank you for taking time to come for your Medicare Wellness Visit. I appreciate your ongoing commitment to your health goals. Please review the following plan we discussed and let me know if I can assist you in the future.   FOLLOW UP WITH DR. COOK AS NEEDED.  RETURN IN JULY FOR FOLLOW UP APPOINTMENT.  BRING ALL MEDICATIONS FOR REVIEW.   This is a list of the screening recommended for you and due dates:  Health Maintenance  Topic Date Due  . Flu Shot  05/24/2016  . Pneumonia vaccines (2 of 2 - PPSV23) 07/22/2016  . Tetanus Vaccine  07/22/2025  . Shingles Vaccine  Completed    Fall Prevention in the Home  Falls can cause injuries. They can happen to people of all ages. There are many things you can do to make your home safe and to help prevent falls.  WHAT CAN I DO ON THE OUTSIDE OF MY HOME?  Regularly fix the edges of walkways and driveways and fix any cracks.  Remove anything that might make you trip as you walk through a door, such as a raised step or threshold.  Trim any bushes or trees on the path to your home.  Use bright outdoor lighting.  Clear any walking paths of anything that might make someone trip, such as rocks or tools.  Regularly check to see if handrails are loose or broken. Make sure that both sides of any steps have handrails.  Any raised decks and porches should have guardrails on the edges.  Have any leaves, snow, or ice cleared regularly.  Use sand or salt on walking paths during winter.  Clean up any spills in your garage right away. This includes oil or grease spills. WHAT CAN I DO IN THE BATHROOM?   Use night lights.  Install grab bars by the toilet and in the tub and shower. Do not use towel bars as grab bars.  Use non-skid mats or decals in the tub or shower.  If you need to sit down in the shower, use a plastic, non-slip stool.  Keep the floor dry. Clean up any water that spills on the floor as soon as it happens.  Remove  soap buildup in the tub or shower regularly.  Attach bath mats securely with double-sided non-slip rug tape.  Do not have throw rugs and other things on the floor that can make you trip. WHAT CAN I DO IN THE BEDROOM?  Use night lights.  Make sure that you have a light by your bed that is easy to reach.  Do not use any sheets or blankets that are too big for your bed. They should not hang down onto the floor.  Have a firm chair that has side arms. You can use this for support while you get dressed.  Do not have throw rugs and other things on the floor that can make you trip. WHAT CAN I DO IN THE KITCHEN?  Clean up any spills right away.  Avoid walking on wet floors.  Keep items that you use a lot in easy-to-reach places.  If you need to reach something above you, use a strong step stool that has a grab bar.  Keep electrical cords out of the way.  Do not use floor polish or wax that makes floors slippery. If you must use wax, use non-skid floor wax.  Do not have throw rugs and other things on the floor that can make you trip. WHAT  CAN I DO WITH MY STAIRS?  Do not leave any items on the stairs.  Make sure that there are handrails on both sides of the stairs and use them. Fix handrails that are broken or loose. Make sure that handrails are as long as the stairways.  Check any carpeting to make sure that it is firmly attached to the stairs. Fix any carpet that is loose or worn.  Avoid having throw rugs at the top or bottom of the stairs. If you do have throw rugs, attach them to the floor with carpet tape.  Make sure that you have a light switch at the top of the stairs and the bottom of the stairs. If you do not have them, ask someone to add them for you. WHAT ELSE CAN I DO TO HELP PREVENT FALLS?  Wear shoes that:  Do not have high heels.  Have rubber bottoms.  Are comfortable and fit you well.  Are closed at the toe. Do not wear sandals.  If you use a  stepladder:  Make sure that it is fully opened. Do not climb a closed stepladder.  Make sure that both sides of the stepladder are locked into place.  Ask someone to hold it for you, if possible.  Clearly mark and make sure that you can see:  Any grab bars or handrails.  First and last steps.  Where the edge of each step is.  Use tools that help you move around (mobility aids) if they are needed. These include:  Canes.  Walkers.  Scooters.  Crutches.  Turn on the lights when you go into a dark area. Replace any light bulbs as soon as they burn out.  Set up your furniture so you have a clear path. Avoid moving your furniture around.  If any of your floors are uneven, fix them.  If there are any pets around you, be aware of where they are.  Review your medicines with your doctor. Some medicines can make you feel dizzy. This can increase your chance of falling. Ask your doctor what other things that you can do to help prevent falls.   This information is not intended to replace advice given to you by your health care provider. Make sure you discuss any questions you have with your health care provider.   Document Released: 08/06/2009 Document Revised: 02/24/2015 Document Reviewed: 11/14/2014 Elsevier Interactive Patient Education [employer].

## 2016-03-18 NOTE — Progress Notes (Signed)
Subjective:   Gary [name] is a 82 y.o. male who presents for an Initial Medicare Annual Wellness Visit.  Review of Systems  No ROS.  Medicare Wellness Visit.  Cardiac Risk Factors include: advanced age (>109mn, >>2women);male gender    Objective:    Today's Vitals   03/18/16 0931  BP: 124/72  Pulse: 72  Temp: 98.1 F (36.7 C)  TempSrc: Oral  Resp: 12  Height: 6' (1.829 m)  Weight: 227 lb 6.4 oz (103.148 kg)  SpO2: 97%   Body mass index is 30.83 kg/(m^2).  Current Medications (verified) Outpatient Encounter Prescriptions as of 03/18/2016  Medication Sig  . apixaban (ELIQUIS) 2.5 MG TABS tablet Take 1 tablet by mouth 2 (two) times daily.   .Marland Kitchenaspirin 81 MG tablet Take 81 mg by mouth daily.  .Marland Kitchenatorvastatin (LIPITOR) 40 MG tablet Take 1 tablet (40 mg total) by mouth daily.  .Marland KitchenbuPROPion (WELLBUTRIN SR) 150 MG 12 hr tablet 150 mg once daily for 3 days. Then increase to 150 mg twice daily.  . clotrimazole-betamethasone (LOTRISONE) cream Apply 1 application topically 2 (two) times daily.  . fenofibrate 160 MG tablet Take 1 tablet by mouth daily.  . metoprolol succinate (TOPROL-XL) 25 MG 24 hr tablet Take 1 tablet by mouth daily.   No facility-administered encounter medications on file as of 03/18/2016.    Allergies (verified) Review of patient's allergies indicates no known allergies.   History: Past Medical History  Diagnosis Date  . Hypertension   . Hyperlipidemia   . Chicken pox   . History of blood transfusion   . AV block     s/p Pacemaker placement.  . Atrial fibrillation ([facility]    Past Surgical History  Procedure Laterality Date  . Prostatectomy    . Hernia repair    . Knee arthroscopy    . Pacemaker placement     Family History  Problem Relation Age of Onset  . Cancer Father   . Heart disease Father   . Cancer Brother    Social History   Occupational History  . Not on file.   Social History Main Topics  . Smoking status: Current Some Day  Smoker -- 0.25 packs/day for 40 years    Types: Cigarettes  . Smokeless tobacco: Never Used  . Alcohol Use: 1.2 oz/week    2 Standard drinks or equivalent per week  . Drug Use: No  . Sexual Activity: Not Currently   Tobacco Counseling Ready to quit: Not Answered Counseling given: Not Answered   Activities of Daily Living In your present state of health, do you have any difficulty performing the following activities: 03/18/2016 05/08/2015  Hearing? YTempie Donning Vision? N Y  Difficulty concentrating or making decisions? Y N  Walking or climbing stairs? Y Y  Dressing or bathing? N N  Doing errands, shopping? Y N  Preparing Food and eating ? Y -  Using the Toilet? N -  In the past six months, have you accidently leaked urine? N -  Managing your Medications? Y -  Managing your Finances? Y -  Housekeeping or managing your Housekeeping? Y -    Immunizations and Health Maintenance Immunization History  Administered Date(s) Administered  . Pneumococcal Conjugate-13 07/23/2015  . Tdap 07/23/2015   There are no preventive care reminders to display for this patient.  Patient Care Team: [name] DO as PCP - General (Family Medicine)  Indicate any recent Medical Services you may have received from  other than [facility] providers in the past year (date may be approximate).    Assessment:   This is a routine wellness examination for Gary [name]. The goal of the wellness visit is to assist the patient how to close the gaps in care and create a preventative care plan for the patient.   Osteoporosis reviewed.  Medications reviewed; additional information needed as how medications are being taken.  Bring all medications to next visit.     Safety issues reviewed; smoke and carbon monoxide detectors in the home. No firearms in the home. Wears seatbelts when riding with others. No violence in the home.  Health maintenance; gap closed.  Atrioventricular block-stable and followed by [name]  MD. Paroxysmal atrial fib-stable and followed by [name] MD. Afib-stable and followed bySerafina Royals MD. Malignant neoplasm-stable and followed by PCP. Chronic airway obstruction-stable and followed by PCP. Claudication of calf muscles-stable and followed by PCP. Periph vascular dis NOS- stable and followed by PCP.  Patient Concerns:  None at this time.  Follow up with PCP as needed.  Hearing/Vision screen Hearing Screening Comments: Some difficulty hearing a whisper Auditory deferred Vision Screening Comments: Followed by [name], [place] Wears glasses Annual visits Last OV 07/2015  Dietary issues and exercise activities discussed: Current Exercise Habits: Home exercise routine [facility] with wife ), Type of exercise: walking, Time (Minutes): 10, Frequency (Times/Week): 5, Weekly Exercise (Minutes/Week): 50, Intensity: Mild  Goals    . Healthy Lifestyle     Stay active and drink plenty of fluids.  Increase water intake. Low carb foods.  Lean meats and vegetables. Stay active and continue walking for exercise.      Depression Screen PHQ 2/9 Scores 03/18/2016 05/08/2015 04/08/2015  PHQ - 2 Score 0 0 0    Fall Risk Fall Risk  03/18/2016 05/08/2015 04/08/2015  Falls in the past year? Yes No Yes  Number falls in past yr: 1 - 1  Injury with Fall? - - No  Follow up Falls prevention discussed;Education provided - -    Cognitive Function: MMSE - Mini Mental State Exam 03/18/2016  Orientation to time 5  Orientation to Place 5  Registration 3  Attention/ Calculation 5  Recall 1  Language- name 2 objects 2  Language- repeat 1  Language- follow 3 step command 3  Language- read & follow direction 1  Write a sentence 1  Copy design 1  Total score 28    Screening Tests Health Maintenance  Topic Date Due  . INFLUENZA VACCINE  05/24/2016  . PNA vac Low Risk Adult (2 of 2 - PPSV23) 07/22/2016  . TETANUS/TDAP  07/22/2025  . ZOSTAVAX  Completed        Plan:   End  of life planning; Advance aging; Advanced directives discussed. Copy of current HCPOA/Living Will requested.   Return in July for follow up with Dr. Lacinda Axon.  Bring all medications to next visit.  During the course of the visit Yaiden was educated and counseled about the following appropriate screening and preventive services:   Vaccines to include Pneumoccal, Influenza, Hepatitis B, Td, Zostavax, HCV  Electrocardiogram  Colorectal cancer screening  Cardiovascular disease screening  Diabetes screening  Glaucoma screening  Nutrition counseling  Prostate cancer screening  Smoking cessation counseling  Patient Instructions (the written plan) were given to the patient.   Varney Biles, LPN   8/34/1960

## 2016-04-21 NOTE — Progress Notes (Signed)
  I have reviewed the above information and agree with above.   Bellamia Ferch, MD 

## 2016-05-06 ENCOUNTER — Ambulatory Visit (INDEPENDENT_AMBULATORY_CARE_PROVIDER_SITE_OTHER): Payer: Medicare Other | Admitting: Family Medicine

## 2016-05-06 ENCOUNTER — Encounter: Payer: Self-pay | Admitting: Family Medicine

## 2016-05-06 VITALS — BP 134/76 | HR 75 | Temp 97.6°F | Resp 17 | Wt 225.8 lb

## 2016-05-06 DIAGNOSIS — I10 Essential (primary) hypertension: Secondary | ICD-10-CM

## 2016-05-06 DIAGNOSIS — I480 Paroxysmal atrial fibrillation: Secondary | ICD-10-CM | POA: Diagnosis not present

## 2016-05-06 DIAGNOSIS — E785 Hyperlipidemia, unspecified: Secondary | ICD-10-CM | POA: Diagnosis not present

## 2016-05-06 DIAGNOSIS — B356 Tinea cruris: Secondary | ICD-10-CM

## 2016-05-06 DIAGNOSIS — R300 Dysuria: Secondary | ICD-10-CM | POA: Diagnosis not present

## 2016-05-06 LAB — POCT URINALYSIS DIPSTICK
BILIRUBIN UA: NEGATIVE
Blood, UA: NEGATIVE
GLUCOSE UA: NEGATIVE
KETONES UA: NEGATIVE
Leukocytes, UA: NEGATIVE
Nitrite, UA: NEGATIVE
PROTEIN UA: NEGATIVE
SPEC GRAV UA: 1.02
Urobilinogen, UA: 1
pH, UA: 6

## 2016-05-06 MED ORDER — ATORVASTATIN CALCIUM 40 MG PO TABS: 40.0000 mg | ORAL_TABLET | Freq: Every day | ORAL | Status: DC

## 2016-05-06 MED ORDER — FLUCONAZOLE 150 MG PO TABS: 150.0000 mg | ORAL_TABLET | Freq: Once | ORAL | Status: DC

## 2016-05-06 NOTE — Progress Notes (Signed)
Pre visit review using our clinic review tool, if applicable. No additional management support is needed unless otherwise documented below in the visit note. 

## 2016-05-06 NOTE — Patient Instructions (Addendum)
Take the diflucan as prescribed.  Take lipitor. Stop simvastatin.  Continue your other medications.  Follow up in 6 months.  Take care  Dr. Lacinda Axon

## 2016-05-08 DIAGNOSIS — B356 Tinea cruris: Secondary | ICD-10-CM | POA: Insufficient documentation

## 2016-05-08 NOTE — Assessment & Plan Note (Signed)
Uncontrolled. Switching to lipitor (patient apparently did not do this at prior visit).

## 2016-05-08 NOTE — Assessment & Plan Note (Signed)
-- 

## 2016-05-08 NOTE — Assessment & Plan Note (Signed)
Stable on Metoprolol and Eliquis.

## 2016-05-08 NOTE — Progress Notes (Signed)
Subjective:  Patient ID: Gary [name], male    DOB: [birth year]  Age: 82 y.o. MRN: [id number]  CC: Follow up  HPI:  82 year old male with HTN, HLD, Afib presents for follow up.  HTN  Well controlled on metoprolol.  No reported side effects.  Afib  Stable on Eliquis and metoprolol.  HLD  Not at goal/uncontrolled.  Currently on simvastatin.  Will discuss today.  Rash  Patient continues to report bothersome growing rash.  Has seen dermatology in the past without improvement.  I have previously treated him and he said no improvement with treatment as well.  Itchy/irritated.  No no relieving factors.  No known exacerbating factors.  No new exposures or changes.  Social Hx   Social History   Social History  . Marital Status: Married    Spouse Name: N/A  . Number of Children: N/A  . Years of Education: N/A   Social History Main Topics  . Smoking status: Current Some Day Smoker -- 0.25 packs/day for 40 years    Types: Cigarettes  . Smokeless tobacco: Never Used  . Alcohol Use: 1.2 oz/week    2 Standard drinks or equivalent per week  . Drug Use: No  . Sexual Activity: Not Currently   Other Topics Concern  . None   Social History Narrative   Review of Systems  Constitutional: Negative.   Respiratory: Negative.   Cardiovascular: Negative.   Genitourinary: Positive for dysuria.  Skin: Positive for rash.   Objective:  BP 134/76 mmHg  Pulse 75  Temp(Src) 97.6 F (36.4 C) (Oral)  Resp 17  Wt 225 lb 12.8 oz (102.422 kg)  SpO2 95%  BP/Weight 05/06/2016 03/18/2016 6/56/8127  Systolic BP 517 001 749  Diastolic BP 76 72 64  Wt. (Lbs) 225.8 227.4 233.5  BMI 30.62 30.83 31.66   Physical Exam  Constitutional: He appears well-developed.  Cardiovascular: Normal rate and regular rhythm.   Pulmonary/Chest: Effort normal. He has no wheezes. He has no rales.  Neurological: He is alert.  Skin:  Groin - erythema noted.   Vitals reviewed.  Lab Results    Component Value Date   WBC 11.0* 05/08/2015   HGB 14.7 06/17/2014   HCT 46.2 05/08/2015   PLT 287 05/08/2015   GLUCOSE 90 05/08/2015   CHOL 177 07/23/2015   TRIG 146.0 07/23/2015   HDL 51.30 07/23/2015   LDLCALC 97 07/23/2015   ALT 19 05/08/2015   AST 21 05/08/2015   NA 136 05/08/2015   K 5.4* 05/08/2015   CL 97 05/08/2015   CREATININE 0.84 05/08/2015   BUN 15 05/08/2015   CO2 26 05/08/2015   PSA <0.1 06/13/2011   HGBA1C 5.8 07/23/2015   Assessment & Plan:   Problem List Items Addressed This Visit    AF (paroxysmal atrial fibrillation) (HCC)    Stable on Metoprolol and Eliquis.      Relevant Medications   atorvastatin (LIPITOR) 40 MG tablet   Hyperlipidemia    Uncontrolled. Switching to lipitor (patient apparently did not do this at prior visit).      Relevant Medications   atorvastatin (LIPITOR) 40 MG tablet   HTN (hypertension) - Primary    Stable. Continue metoprolol.       Relevant Medications   atorvastatin (LIPITOR) 40 MG tablet   Tinea cruris    Rash consistent with this. Given lack of improvement with topicals will try course of Diflucan.      Relevant Medications   fluconazole (  DIFLUCAN) 150 MG tablet    Other Visit Diagnoses    Dysuria        Relevant Orders    POCT Urinalysis Dipstick (Completed)       Meds ordered this encounter  Medications  . atorvastatin (LIPITOR) 40 MG tablet    Sig: Take 1 tablet (40 mg total) by mouth daily.    Dispense:  90 tablet    Refill:  3  . fluconazole (DIFLUCAN) 150 MG tablet    Sig: Take 1 tablet (150 mg total) by mouth once.    Dispense:  14 tablet    Refill:  0   Follow-up: PRN  [place]

## 2016-05-08 NOTE — Assessment & Plan Note (Signed)
Rash consistent with this. Given lack of improvement with topicals will try course of Diflucan.

## 2016-05-09 ENCOUNTER — Telehealth: Payer: Self-pay | Admitting: *Deleted

## 2016-05-09 MED ORDER — FLUCONAZOLE 150 MG PO TABS: 150.0000 mg | ORAL_TABLET | Freq: Every day | ORAL | Status: DC

## 2016-05-09 NOTE — Telephone Encounter (Signed)
Called patient's  Wife, resent the prescription, thanks

## 2016-05-09 NOTE — Telephone Encounter (Signed)
Pt's wife stated that pharmacy did not receive the Rx for pt. jock itch Pt contact  [phone]

## 2016-05-10 ENCOUNTER — Other Ambulatory Visit: Payer: Self-pay | Admitting: Family Medicine

## 2016-05-10 ENCOUNTER — Telehealth: Payer: Self-pay | Admitting: *Deleted

## 2016-05-10 NOTE — Telephone Encounter (Signed)
Urine neg.

## 2016-05-10 NOTE — Telephone Encounter (Signed)
Patient has requested results of his urine specimen Pt contact  [phone]

## 2016-05-10 NOTE — Telephone Encounter (Signed)
PAtient had a POCT urine completed at [place], did we not send it out? thanks

## 2016-05-10 NOTE — Telephone Encounter (Signed)
Notified patient of results, thanks

## 2016-05-13 ENCOUNTER — Telehealth: Payer: Self-pay | Admitting: *Deleted

## 2016-05-13 ENCOUNTER — Other Ambulatory Visit: Payer: Self-pay | Admitting: Family Medicine

## 2016-05-13 DIAGNOSIS — R21 Rash and other nonspecific skin eruption: Secondary | ICD-10-CM

## 2016-05-13 NOTE — Telephone Encounter (Signed)
Patient's wife has requested to have her husband referred to a dermatologist  She requested a call [phone]

## 2016-06-08 ENCOUNTER — Ambulatory Visit: Payer: Medicare Other | Admitting: Family Medicine

## 2016-09-29 ENCOUNTER — Telehealth: Payer: Self-pay | Admitting: *Deleted

## 2016-09-29 NOTE — Telephone Encounter (Signed)
Wife was called and pt scheduled for 8 a.m. To see Dr.Cook.

## 2016-09-29 NOTE — Telephone Encounter (Signed)
Pt's wife stated that pt is having trouble with walking, this has been going on for a week. She questioned if she should schedule an appt here in the office or take him to the emergency room. Pt did not have a fall,or any new health issues that could have declined his walking. Wife has concerns that pt has had some weakness,more than usual. Contact : Pamala Hurry [phone]

## 2016-09-30 ENCOUNTER — Encounter: Payer: Self-pay | Admitting: Emergency Medicine

## 2016-09-30 ENCOUNTER — Emergency Department: Payer: Medicare Other

## 2016-09-30 ENCOUNTER — Telehealth: Payer: Self-pay | Admitting: Family Medicine

## 2016-09-30 ENCOUNTER — Ambulatory Visit (INDEPENDENT_AMBULATORY_CARE_PROVIDER_SITE_OTHER): Payer: Medicare Other | Admitting: Family Medicine

## 2016-09-30 ENCOUNTER — Encounter: Payer: Self-pay | Admitting: Family Medicine

## 2016-09-30 ENCOUNTER — Emergency Department
Admission: EM | Admit: 2016-09-30 | Discharge: 2016-09-30 | Disposition: A | Discharge status: Home / Self Care | Payer: Medicare Other | Attending: Emergency Medicine | Admitting: Emergency Medicine

## 2016-09-30 DIAGNOSIS — Z950 Presence of cardiac pacemaker: Secondary | ICD-10-CM | POA: Insufficient documentation

## 2016-09-30 DIAGNOSIS — R531 Weakness: Secondary | ICD-10-CM | POA: Diagnosis present

## 2016-09-30 DIAGNOSIS — R262 Difficulty in walking, not elsewhere classified: Secondary | ICD-10-CM | POA: Insufficient documentation

## 2016-09-30 DIAGNOSIS — F17210 Nicotine dependence, cigarettes, uncomplicated: Secondary | ICD-10-CM | POA: Diagnosis not present

## 2016-09-30 DIAGNOSIS — Z79899 Other long term (current) drug therapy: Secondary | ICD-10-CM | POA: Diagnosis not present

## 2016-09-30 DIAGNOSIS — R079 Chest pain, unspecified: Secondary | ICD-10-CM | POA: Diagnosis not present

## 2016-09-30 DIAGNOSIS — I10 Essential (primary) hypertension: Secondary | ICD-10-CM | POA: Insufficient documentation

## 2016-09-30 DIAGNOSIS — R29898 Other symptoms and signs involving the musculoskeletal system: Secondary | ICD-10-CM | POA: Diagnosis not present

## 2016-09-30 LAB — HEPATIC FUNCTION PANEL
ALT: 24 U/L (ref 17–63)
AST: 25 U/L (ref 15–41)
Albumin: 2.9 g/dL — ABNORMAL LOW (ref 3.5–5.0)
Alkaline Phosphatase: 93 U/L (ref 38–126)
BILIRUBIN DIRECT: 0.3 mg/dL (ref 0.1–0.5)
BILIRUBIN INDIRECT: 0.4 mg/dL (ref 0.3–0.9)
Total Bilirubin: 0.7 mg/dL (ref 0.3–1.2)
Total Protein: 7.2 g/dL (ref 6.5–8.1)

## 2016-09-30 LAB — CBC
HEMATOCRIT: 37.1 % — AB (ref 40.0–52.0)
HEMOGLOBIN: 12.5 g/dL — AB (ref 13.0–18.0)
MCH: 31.3 pg (ref 26.0–34.0)
MCHC: 33.8 g/dL (ref 32.0–36.0)
MCV: 92.5 fL (ref 80.0–100.0)
Platelets: 493 10*3/uL — ABNORMAL HIGH (ref 150–440)
RBC: 4.01 MIL/uL — AB (ref 4.40–5.90)
RDW: 13.5 % (ref 11.5–14.5)
WBC: 13 10*3/uL — ABNORMAL HIGH (ref 3.8–10.6)

## 2016-09-30 LAB — URINALYSIS, COMPLETE (UACMP) WITH MICROSCOPIC
Bacteria, UA: NONE SEEN
Bilirubin Urine: NEGATIVE
GLUCOSE, UA: NEGATIVE mg/dL
HGB URINE DIPSTICK: NEGATIVE
KETONES UR: NEGATIVE mg/dL
Leukocytes, UA: NEGATIVE
Nitrite: NEGATIVE
PROTEIN: NEGATIVE mg/dL
Specific Gravity, Urine: 1.025 (ref 1.005–1.030)
pH: 5 (ref 5.0–8.0)

## 2016-09-30 LAB — BASIC METABOLIC PANEL
ANION GAP: 7 (ref 5–15)
BUN: 24 mg/dL — ABNORMAL HIGH (ref 6–20)
CALCIUM: 9 mg/dL (ref 8.9–10.3)
CHLORIDE: 103 mmol/L (ref 101–111)
CO2: 26 mmol/L (ref 22–32)
Creatinine, Ser: 0.87 mg/dL (ref 0.61–1.24)
GFR calc non Af Amer: >60 mL/min (ref >60)
Glucose, Bld: 108 mg/dL — ABNORMAL HIGH (ref 65–99)
POTASSIUM: 4.6 mmol/L (ref 3.5–5.1)
Sodium: 136 mmol/L (ref 135–145)

## 2016-09-30 LAB — TROPONIN I: Troponin I: <0.03 ng/mL (ref <0.03)

## 2016-09-30 MED ORDER — SODIUM CHLORIDE 0.9 % IV BOLUS (SEPSIS)
1000.0000 mL | Freq: Once | INTRAVENOUS | Status: AC
  Administered 2016-09-30: 1000 mL via INTRAVENOUS

## 2016-09-30 NOTE — Assessment & Plan Note (Signed)
New problem to me. This appears to being ongoing issue. Suspect slow decline over the past several months. Likely related to underlying comorbidities and deconditioning. He has had ABIs which were unremarkable. May need further evaluation with ultrasound to ensure no underlying issues with arterial blood flow given symptoms. However, given his ongoing weakness in association with other complaints today, sending to the emergency department.

## 2016-09-30 NOTE — Progress Notes (Signed)
Subjective:  Patient ID: Gary [name], male    DOB: Feb 24, 1930  Age: 82 y.o. MRN: [id number]  CC:   HPI:  82 year old male with past medical history of tobacco abuse, hypertension, hyperlipidemia, atrial fibrillation presents with complaints of leg weakness/difficulty with ambulation.  He has also had some chest pain.  Patient endorses ongoing weakness particularly of the legs the past 6 months. He's had difficulty cannulating. His wife has noticed more abrupt decline over the past week. He is now using a walker. He was using a cane previously. She also states that he has had decreased appetite. Patient endorses pain in his lower extremities with ambulation. He states that it improves with rest. He has had prior ABIs that were unremarkable. He states that his legs feel "weak".  Additionally, patient states that he's had ongoing intermittent chest pain. Last episode was yesterday. Left-sided. Associated with shortness of breath. Lasted about 10 minutes. No associated nausea or diaphoresis. He has significant cardiac risk factors.  Social Hx   Social History   Social History  . Marital status: Married    Spouse name: N/A  . Number of children: N/A  . Years of education: N/A   Social History Main Topics  . Smoking status: Current Some Day Smoker    Packs/day: 0.25    Years: 40.00    Types: Cigarettes  . Smokeless tobacco: Never Used  . Alcohol use 1.2 oz/week    2 Standard drinks or equivalent per week  . Drug use: No  . Sexual activity: Not Currently   Other Topics Concern  . None   Social History Narrative  . None    Review of Systems  Constitutional: Positive for activity change and appetite change.  Respiratory: Positive for shortness of breath.   Cardiovascular: Positive for chest pain.   Objective:  BP 102/66 (BP Location: Left Arm, Patient Position: Sitting, Cuff Size: Normal)   Pulse (!) 57   Temp 97.7 F (36.5 C) (Oral)   Resp 14   Wt 218 lb (98.9 kg)    SpO2 90%   BMI 29.57 kg/m   BP/Weight 09/30/2016 05/06/2016 3/81/8299  Systolic BP 371 696 789  Diastolic BP 66 76 72  Wt. (Lbs) 218 225.8 227.4  BMI 29.57 30.62 30.83   Physical Exam  Constitutional: He appears well-developed. No distress.  Sitting in wheelchair.   Cardiovascular: Regular rhythm.   Bradycardic.  Decreased pedal pulses.  Pulmonary/Chest: Effort normal. No respiratory distress. He has no wheezes. He has no rales.  Neurological: He is alert.  Psychiatric:  Flat affect.   Vitals reviewed.  Lab Results  Component Value Date   WBC 11.0 (H) 05/08/2015   HGB 14.7 06/17/2014   HCT 46.2 05/08/2015   PLT 287 05/08/2015   GLUCOSE 90 05/08/2015   CHOL 177 07/23/2015   TRIG 146.0 07/23/2015   HDL 51.30 07/23/2015   LDLCALC 97 07/23/2015   ALT 19 05/08/2015   AST 21 05/08/2015   NA 136 05/08/2015   K 5.4 (H) 05/08/2015   CL 97 05/08/2015   CREATININE 0.84 05/08/2015   BUN 15 05/08/2015   CO2 26 05/08/2015   PSA <0.1 06/13/2011   HGBA1C 5.8 07/23/2015    Assessment & Plan:   Problem List Items Addressed This Visit    Weakness of both legs    New problem to me. This appears to being ongoing issue. Suspect slow decline over the past several months. Likely related to underlying comorbidities and deconditioning.  He has had ABIs which were unremarkable. May need further evaluation with ultrasound to ensure no underlying issues with arterial blood flow given symptoms. However, given his ongoing weakness in association with other complaints today, sending to the emergency department.       Chest pain    New problem. Patient states these had intermittent chest pain recently. Most recent episode was yesterday. Has significant comorbidities. Sending to the ER for cardiac evaluation and likely stress test versus catheterization.        Follow-up: Following hospital stay.  [place]

## 2016-09-30 NOTE — ED Triage Notes (Signed)
Pt arrived a&o x2.  Wife states he went to MD office today for weakness and was sent here for further eval.

## 2016-09-30 NOTE — ED Provider Notes (Signed)
[facility] Emergency Department Provider Note   ____________________________________________   First MD Initiated Contact with Patient 09/30/16 [phone]     (approximate)  I have reviewed the triage vital signs and the nursing notes.   HISTORY  Chief Complaint Weakness    HPI Gary [name] is a 82 y.o. male 's history of A. fib on anticoagulant with pacemaker permanent  Patient is wife both report that for the last several months she's had to use a cane. He's had somewhat progressive but slowly increasing weakness, not as well able to walk over the last several months but slowly worsening. He seems tired frequently.  The last week he has required use of a walker. Wife reports that occasionally he'll see his chest will hurt for a second, this is been ongoing for several weeks and the patient reports a minor sharp sensation in the chest that come and go. Not associated with exertion. Currently not having any pain or discomfort.  The size doctor today, they recommended he come for further evaluation regarding increasing weakness.  Patient reports that he has had slight weakness in the left leg worse than the right, but reports this is been an ongoing issue for several months. There has been no immediate change or acute sudden onset of weakness. Patient wife both report seems to be a slowly worsening problem.  Past Medical History:  Diagnosis Date  . Atrial fibrillation ([place])   . AV block    s/p Pacemaker placement.  . Chicken pox   . History of blood transfusion   . Hyperlipidemia   . Hypertension     Patient Active Problem List   Diagnosis Date Noted  . Weakness of both legs 09/30/2016  . Chest pain 09/30/2016  . Tinea cruris 05/08/2016  . AV block, complete ([place]) 07/24/2015  . Tobacco abuse 07/24/2015  . HTN (hypertension) 07/24/2015  . Diminished pulses in lower extremity 07/24/2015  . Hyperlipidemia 07/23/2015  . H/O malignant neoplasm of  prostate 05/08/2015  . AF (paroxysmal atrial fibrillation) ([place]) 04/08/2015    Past Surgical History:  Procedure Laterality Date  . HERNIA REPAIR    . KNEE ARTHROSCOPY    . PACEMAKER PLACEMENT    . PROSTATECTOMY      Prior to Admission medications   Medication Sig Start Date End Date Taking? Authorizing Provider  apixaban (ELIQUIS) 2.5 MG TABS tablet Take 1 tablet by mouth 2 (two) times daily.    Yes Historical Provider, MD  atorvastatin (LIPITOR) 40 MG tablet Take 1 tablet (40 mg total) by mouth daily. 05/06/16  Yes Coral Spikes, DO  metoprolol succinate (TOPROL-XL) 25 MG 24 hr tablet Take 1 tablet by mouth daily. 02/12/15  Yes Roselee Nova, MD  buPROPion [facility] SR) 150 MG 12 hr tablet 150 mg once daily for 3 days. Then increase to 150 mg twice daily. Patient not taking: Reported on 09/30/2016 07/23/15   Coral Spikes, DO    Allergies Patient has no known allergies.  Family History  Problem Relation Age of Onset  . Cancer Father   . Heart disease Father   . Cancer Brother     Social History Social History  Substance Use Topics  . Smoking status: Current Some Day Smoker    Packs/day: 0.25    Years: 40.00    Types: Cigarettes  . Smokeless tobacco: Never Used  . Alcohol use 1.2 oz/week    2 Standard drinks or equivalent per week  Review of Systems Constitutional: No fever/chills Eyes: No visual changes. ENT: No sore throat. Cardiovascular: See history of present illness Respiratory: Denies shortness of breath. Gastrointestinal: No abdominal pain.  No nausea, no vomiting.  No diarrhea.  No constipation. Only drank a half cups of coffee today. Somewhat poor appetite for the last several weeks. Genitourinary: Negative for dysuria. Urine is 3-4 times during the night each night, this is been an ongoing issue for years Musculoskeletal: Negative for back pain. Skin: Negative for rash. Neurological: Negative for headaches, focal weakness or numbness.  10-point ROS  otherwise negative.  ____________________________________________   PHYSICAL EXAM:  VITAL SIGNS: ED Triage Vitals  Enc Vitals Group     BP 09/30/16 0914 117/66     Pulse Rate 09/30/16 0914 62     Resp 09/30/16 0914 16     Temp 09/30/16 0914 97.7 F (36.5 C)     Temp Source 09/30/16 0914 Oral     SpO2 09/30/16 0914 98 %     Weight --      Height --      Head Circumference --      Peak Flow --      Pain Score 09/30/16 0907 3     Pain Loc --      Pain Edu? --      Excl. in [place]? --     Constitutional: Alert and oriented. Well appearing and in no acute distress.Patient and his wife are both very pleasant. Eyes: Conjunctivae are normal. PERRL. EOMI. Head: Atraumatic. Nose: No congestion/rhinnorhea. Mouth/Throat: Mucous membranes are dry.  Oropharynx non-erythematous. Neck: No stridor.   Cardiovascular: Slight irregular r rhythm. Grossly normal heart sounds.  Good peripheral circulation. Respiratory: Normal respiratory effort.  No retractions. Lungs CTAB. Gastrointestinal: Soft and nontender. No distention.  Musculoskeletal: No lower extremity tenderness nor edema.  No joint effusions. Neurologic:  Normal speech and language. No gross focal neurologic deficits are appreciated except for some very mild weakness in the left lower leg versus the right, the patient reports this has been issue ongoing for several months.    The patient has no pronator drift. The patient has normal cranial nerve exam. Extraocular movements are normal. Visual fields are normal. Patient has 5 out of 5 strength in all extremities except for just slightly less than 5/5 in the left lower leg. There is no numbness or gross, acute sensory abnormality in the extremities bilaterally. No speech disturbance. No dysarthria. No aphasia. No ataxia. Patient speaking in full and clear sentences.   Skin:  Skin is warm, dry and intact. No rash noted. Psychiatric: Mood and affect are normal. Speech and behavior  are normal.  ____________________________________________   LABS (all labs ordered are listed, but only abnormal results are displayed)  Labs Reviewed  BASIC METABOLIC PANEL - Abnormal; Notable for the following:       Result Value   Glucose, Bld 108 (*)    BUN 24 (*)    All other components within normal limits  CBC - Abnormal; Notable for the following:    WBC 13.0 (*)    RBC 4.01 (*)    Hemoglobin 12.5 (*)    HCT 37.1 (*)    Platelets 493 (*)    All other components within normal limits  URINALYSIS, COMPLETE (UACMP) WITH MICROSCOPIC - Abnormal; Notable for the following:    Color, Urine AMBER (*)    APPearance CLEAR (*)    Squamous Epithelial / LPF 0-5 (*)    All  other components within normal limits  HEPATIC FUNCTION PANEL - Abnormal; Notable for the following:    Albumin 2.9 (*)    All other components within normal limits  TROPONIN I   ____________________________________________  EKG  Reviewed and interpreted by me at 9:10 AM Ventricular rate 60 QTc 480 QRS 140 Underlying A. fib versus a flutter, ventricular pacing. Left bundle branch block due to pacing. No evidence of an acute ischemia is noted ____________________________________________  RADIOLOGY  Ct Head Wo Contrast  Result Date: 09/30/2016 CLINICAL DATA:  Leg weakness and difficulty with ambulation. EXAM: CT HEAD WITHOUT CONTRAST TECHNIQUE: Contiguous axial images were obtained from the base of the skull through the vertex without intravenous contrast. COMPARISON:  None. FINDINGS: Brain: Cerebral atrophy and cerebellar atrophy. No evidence for acute hemorrhage, mass lesion, midline shift, hydrocephalus or large infarct. Low-density throughout the subcortical and periventricular white matter. Few calcifications in the basal ganglia. Vascular: No hyperdense vessel or unexpected calcification. Skull: Normal. Negative for fracture or focal lesion. Sinuses/Orbits: No acute finding. Other: None. IMPRESSION: No  acute intracranial abnormality. Atrophy with diffuse white matter disease. White matter disease may represent chronic small vessel ischemic changes. Electronically Signed   By: Markus Daft M.D.   On: 09/30/2016 10:13    ____________________________________________   PROCEDURES  Procedure(s) performed: None  Procedures  Critical Care performed: No  ____________________________________________   INITIAL IMPRESSION / ASSESSMENT AND PLAN / ED COURSE  Pertinent labs & imaging results that were available during my care of the patient were reviewed by me and considered in my medical decision making (see chart for details).  Patient here for generalized weakness. This seems very slowly progressive in nature over several months. Now having to use a walker at home. He does a some slight weakness in the left lower leg but reports is been ongoing issue for him for "months to a year" and denies any new or worsened onset of change. I do not find high suspicion for an acute stroke or acute neurologic process. Patient is having some occasional sharp chest pains for several weeks now, currently not having any today and his EKG shows a expected the pacing with underlying probable flutter area  Patient is anticoagulated. Low risk for thromboembolic pulmonary embolism, slightly elevated risk for stroke though due to his underlying arrhythmia however again he is anticoagulated.  Final evidence support acute coronary syndrome. We will proceed with CT of the head to evaluate for possibility of a stroke, though I would suspect it to be likely subacute or remote in nature if even there. He cannot have an MRI.  Check basic labs, evaluate for anemia, infection, and other causes of weakness. We'll hydrate here and reevaluate.  Clinical Course     ----------------------------------------- 1:00 PM on 09/30/2016 -----------------------------------------  Patient labs reassuring. A minimal leukocytosis, clean  urine and the patient denies any acute pulmonary symptoms or infectious symptoms.  I suspect likely given the history that the patient is suffering from some deconditioning and possible slight dehydration. The patient and his wife are both very agreeable and, patient requesting to go home now. I see no evidence of an acute neurologic event, acute cardiac event or other acute illness at this time. Discussed with patient and his wife they will call her doctor today, discussed close follow-up and possibly obtaining physical therapy to assist him.  Return precautions and treatment recommendations and follow-up discussed with the patient who is agreeable with the plan.  ____________________________________________   FINAL CLINICAL IMPRESSION(S) /  ED DIAGNOSES  Final diagnoses:  Generalized weakness      NEW MEDICATIONS STARTED DURING THIS VISIT:  New Prescriptions   No medications on file     Note:  This document was prepared using Dragon voice recognition software and may include unintentional dictation errors.     Delman Kitten, MD 09/30/16 [phone]

## 2016-09-30 NOTE — ED Notes (Signed)
Patient transported to CT 

## 2016-09-30 NOTE — Telephone Encounter (Signed)
FYI - Pt wife called and stated that pt went to the ED and they said that everything was fine. He just needs to exercise more and drink more water.

## 2016-09-30 NOTE — Progress Notes (Signed)
Pre visit review using our clinic review tool, if applicable. No additional management support is needed unless otherwise documented below in the visit note. 

## 2016-09-30 NOTE — Assessment & Plan Note (Signed)
New problem. Patient states these had intermittent chest pain recently. Most recent episode was yesterday. Has significant comorbidities. Sending to the ER for cardiac evaluation and likely stress test versus catheterization.

## 2016-09-30 NOTE — Patient Instructions (Signed)
Go ahead and to him to the ED.  Follow up after hospital stay.  Take care  Dr. Lacinda Axon

## 2016-09-30 NOTE — Telephone Encounter (Signed)
FYI

## 2017-03-21 ENCOUNTER — Ambulatory Visit: Payer: Medicare Other

## 2017-03-21 DIAGNOSIS — R0602 Shortness of breath: Secondary | ICD-10-CM | POA: Insufficient documentation

## 2017-03-22 ENCOUNTER — Ambulatory Visit: Payer: Medicare Other

## 2017-04-17 ENCOUNTER — Ambulatory Visit (INDEPENDENT_AMBULATORY_CARE_PROVIDER_SITE_OTHER): Payer: Medicare Other

## 2017-04-17 VITALS — BP 102/62 | HR 60 | Temp 98.0°F | Resp 16 | Ht 72.0 in | Wt 218.8 lb

## 2017-04-17 DIAGNOSIS — Z1389 Encounter for screening for other disorder: Secondary | ICD-10-CM | POA: Diagnosis not present

## 2017-04-17 DIAGNOSIS — Z0000 Encounter for general adult medical examination without abnormal findings: Secondary | ICD-10-CM | POA: Diagnosis not present

## 2017-04-17 DIAGNOSIS — Z23 Encounter for immunization: Secondary | ICD-10-CM | POA: Diagnosis not present

## 2017-04-17 DIAGNOSIS — Z1331 Encounter for screening for depression: Secondary | ICD-10-CM

## 2017-04-17 NOTE — Progress Notes (Signed)
Subjective:   Gary [name] is a 83 y.o. male who presents for Medicare Annual/Subsequent preventive examination.  Review of Systems:  No ROS.  Medicare Wellness Visit. Additional risk factors are reflected in the social history. Cardiac Risk Factors include: advanced age (>43men, >16 women);hypertension;male gender     Objective:    Vitals: BP 102/62 (BP Location: Right Arm, Patient Position: Sitting, Cuff Size: Normal)   Pulse 60   Temp 98 F (36.7 C) (Oral)   Resp 16   Ht 6' (1.829 m)   Wt 218 lb 12.8 oz (99.2 kg)   SpO2 96%   BMI 29.67 kg/m   Body mass index is 29.67 kg/m.  Tobacco History  Smoking Status  . Current Some Day Smoker  . Packs/day: 0.25  . Years: 40.00  . Types: Cigarettes  Smokeless Tobacco  . Never Used     Ready to quit: Not Answered Counseling given: Not Answered   Past Medical History:  Diagnosis Date  . Atrial fibrillation ([place])   . AV block    s/p Pacemaker placement.  . Chicken pox   . History of blood transfusion   . Hyperlipidemia   . Hypertension    Past Surgical History:  Procedure Laterality Date  . HERNIA REPAIR    . KNEE ARTHROSCOPY    . PACEMAKER PLACEMENT    . PROSTATECTOMY     Family History  Problem Relation Age of Onset  . Cancer Father   . Heart disease Father   . Cancer Brother    History  Sexual Activity  . Sexual activity: Not Currently    Outpatient Encounter Prescriptions as of 04/17/2017  Medication Sig  . apixaban (ELIQUIS) 2.5 MG TABS tablet Take 1 tablet by mouth 2 (two) times daily.   Marland Kitchen atorvastatin (LIPITOR) 40 MG tablet Take 1 tablet (40 mg total) by mouth daily.  . metoprolol succinate (TOPROL-XL) 25 MG 24 hr tablet Take 1 tablet by mouth daily.  Marland Kitchen buPROPion (WELLBUTRIN SR) 150 MG 12 hr tablet 150 mg once daily for 3 days. Then increase to 150 mg twice daily. (Patient not taking: Reported on 09/30/2016)   No facility-administered encounter medications on file as of 04/17/2017.     Activities  of Daily Living In your present state of health, do you have any difficulty performing the following activities: 04/17/2017  Hearing? Y  Vision? N  Difficulty concentrating or making decisions? N  Walking or climbing stairs? Y  Dressing or bathing? N  Doing errands, shopping? Y  Preparing Food and eating ? Y  Using the Toilet? N  In the past six months, have you accidently leaked urine? N  Do you have problems with loss of bowel control? N  Managing your Medications? Y  Managing your Finances? Y  Housekeeping or managing your Housekeeping? Y  Some recent data might be hidden    Patient Care Team: Coral Spikes, DO as PCP - General (Family Medicine)   Assessment:    This is a routine wellness examination for [name]. The goal of the wellness visit is to assist the patient how to close the gaps in care and create a preventative care plan for the patient.   The roster of all physicians providing medical care to patient is listed in the Snapshot section of the chart.  Osteoporosis risk reviewed.    Safety issues reviewed; Smoke and carbon monoxide detectors in the home. No firearms in the home.  Wears seatbelts when riding with others.  Patient does wear sunscreen or protective clothing when in direct sunlight. No violence in the home.  Depression- PHQ 2 &9 complete.  No signs/symptoms or verbal communication regarding little pleasure in doing things, feeling down, depressed or hopeless. No changes in sleeping, energy, eating, concentrating.  No thoughts of self harm or harm towards others.  Time spent on this topic is 8 minutes.   Patient is alert, normal appearance, oriented to person/place/and time.  Correctly identified the president of the [place], recall of 3/3 words, and performing simple calculations. Displays appropriate judgement and can read correct time from watch face.   No new identified risk were noted.  No failures at ADL's or IADL's.  Ambulates with cane, walker,  wheelchair.  BMI- discussed the importance of a healthy diet, water intake and the benefits of aerobic exercise. Educational material provided.   Daily fluid intake: 2 cups of caffeine, 3 cups of water  Dental- every 6 months.  Eye- Visual acuity not assessed per patient preference since they have regular follow up with the ophthalmologist.  Wears corrective lenses.  Sleep patterns- Sleeps 6-7 hours at night.  Wakes feeling rested.  Naps during the day.  Pneumovax 23 vaccine administered R deltoid, tolerated well.  Educational material provided.  Health maintenance gaps- closed.  Patient Concerns: None at this time. Follow up with PCP as needed.  Exercise Activities and Dietary recommendations Current Exercise Habits: The patient does not participate in regular exercise at present  Goals    . Healthy Lifestyle          Stay active and drink plenty of fluids/water. Low carb foods.  Lean meats and vegetables. Stay active.  Chair/sitting exercises.  Walking for exercise.      Fall Risk Fall Risk  04/17/2017 03/18/2016 05/08/2015 04/08/2015  Falls in the past year? No Yes No Yes  Number falls in past yr: - 1 - 1  Injury with Fall? - - - No  Follow up - Falls prevention discussed;Education provided - -   Depression Screen PHQ 2/9 Scores 04/17/2017 03/18/2016 05/08/2015 04/08/2015  PHQ - 2 Score 0 0 0 0  PHQ- 9 Score 0 - - -    Cognitive Function MMSE - Mini Mental State Exam 04/17/2017 03/18/2016  Orientation to time 5 5  Orientation to Place 5 5  Registration 3 3  Attention/ Calculation 5 5  Recall 3 1  Language- name 2 objects 2 2  Language- repeat 1 1  Language- follow 3 step command 3 3  Language- read & follow direction 1 1  Write a sentence 1 1  Copy design 1 1  Total score 30 28        Immunization History  Administered Date(s) Administered  . Pneumococcal Conjugate-13 07/23/2015  . Pneumococcal Polysaccharide-23 04/17/2017  . Tdap 07/23/2015   Screening  Tests Health Maintenance  Topic Date Due  . INFLUENZA VACCINE  09/30/2017 (Originally 05/24/2017)  . TETANUS/TDAP  07/22/2025  . PNA vac Low Risk Adult  Completed      Plan:    End of life planning; Advance aging; Advanced directives discussed. Copy of current HCPOA/Living Will requested.    I have personally reviewed and noted the following in the patient's chart:   . Medical and social history . Use of alcohol, tobacco or illicit drugs  . Current medications and supplements . Functional ability and status . Nutritional status . Physical activity . Advanced directives . List of other physicians . Hospitalizations, surgeries, and ER visits  in previous 12 months . Vitals . Screenings to include cognitive, depression, and falls . Referrals and appointments  In addition, I have reviewed and discussed with patient certain preventive protocols, quality metrics, and best practice recommendations. A written personalized care plan for preventive services as well as general preventive health recommendations were provided to patient.     Varney Biles, LPN  [id number]/[id number]

## 2017-04-17 NOTE — Patient Instructions (Addendum)
  Mr. Gary [name] , Thank you for taking time to come for your Medicare Wellness Visit. I appreciate your ongoing commitment to your health goals. Please review the following plan we discussed and let me know if I can assist you in the future.   Follow up with Dr. Lacinda Axon as needed.    Bring a copy of your [place] and/or Living Will to be scanned into chart.  Have a great day!  These are the goals we discussed: Goals    . Healthy Lifestyle          Stay active and drink plenty of fluids/water. Low carb foods.  Lean meats and vegetables. Stay active.  Chair/sitting exercises.  Walking for exercise.       This is a list of the screening recommended for you and due dates:  Health Maintenance  Topic Date Due  . Flu Shot  09/30/2017*  . Tetanus Vaccine  07/22/2025  . Pneumonia vaccines  Completed  *Topic was postponed. The date shown is not the original due date.

## 2017-08-24 ENCOUNTER — Other Ambulatory Visit: Payer: Self-pay | Admitting: Family Medicine

## 2017-09-05 ENCOUNTER — Telehealth: Payer: Self-pay | Admitting: Family Medicine

## 2017-09-05 MED ORDER — ATORVASTATIN CALCIUM 40 MG PO TABS: 40.0000 mg | ORAL_TABLET | Freq: Every day | ORAL | 0 refills | Status: DC

## 2017-09-05 NOTE — Telephone Encounter (Signed)
Pt has appt on 1/25 to establish care. Pt requesting refill of Lipitor, pt is out of the medication. Does pt have to go to appt before Lipitor can be refilled?

## 2017-09-05 NOTE — Telephone Encounter (Signed)
Medication filled as requested til appointment.

## 2017-09-05 NOTE — Telephone Encounter (Signed)
Copied from [place]. Topic: Quick Communication - See Telephone Encounter >> Sep 05, 2017 12:22 PM Burnis Medin, NT wrote: CRM for notification. See Telephone encounter for: Pt. Is calling because he is out of cholesterol medication.  Pt.can't remember what the name of medication is. Pt. Uses [facility] on [address]. Pt. Is establishing care with Dr. Caryl Bis because PCP moved.   09/05/17.

## 2017-11-17 ENCOUNTER — Encounter: Payer: Self-pay | Admitting: Family Medicine

## 2017-11-17 ENCOUNTER — Ambulatory Visit: Payer: Medicare Other | Admitting: Family Medicine

## 2017-11-17 ENCOUNTER — Other Ambulatory Visit: Payer: Self-pay

## 2017-11-17 VITALS — BP 100/60 | HR 66 | Temp 96.4°F | Wt 223.8 lb

## 2017-11-17 DIAGNOSIS — Z8546 Personal history of malignant neoplasm of prostate: Secondary | ICD-10-CM

## 2017-11-17 DIAGNOSIS — I10 Essential (primary) hypertension: Secondary | ICD-10-CM | POA: Diagnosis not present

## 2017-11-17 DIAGNOSIS — Z720 Tobacco use: Secondary | ICD-10-CM

## 2017-11-17 DIAGNOSIS — I480 Paroxysmal atrial fibrillation: Secondary | ICD-10-CM | POA: Diagnosis not present

## 2017-11-17 DIAGNOSIS — E785 Hyperlipidemia, unspecified: Secondary | ICD-10-CM

## 2017-11-17 DIAGNOSIS — I442 Atrioventricular block, complete: Secondary | ICD-10-CM

## 2017-11-17 DIAGNOSIS — R29898 Other symptoms and signs involving the musculoskeletal system: Secondary | ICD-10-CM

## 2017-11-17 LAB — LIPID PANEL
CHOLESTEROL: 138 mg/dL (ref 0–200)
HDL: 41.8 mg/dL (ref >39.00)
LDL Cholesterol: 73 mg/dL (ref 0–99)
NonHDL: 96.52
TRIGLYCERIDES: 117 mg/dL (ref 0.0–149.0)
Total CHOL/HDL Ratio: 3
VLDL: 23.4 mg/dL (ref 0.0–40.0)

## 2017-11-17 LAB — COMPREHENSIVE METABOLIC PANEL
ALBUMIN: 4 g/dL (ref 3.5–5.2)
ALK PHOS: 85 U/L (ref 39–117)
ALT: 17 U/L (ref 0–53)
AST: 16 U/L (ref 0–37)
BILIRUBIN TOTAL: 0.8 mg/dL (ref 0.2–1.2)
BUN: 19 mg/dL (ref 6–23)
CALCIUM: 9.5 mg/dL (ref 8.4–10.5)
CHLORIDE: 101 meq/L (ref 96–112)
CO2: 25 mEq/L (ref 19–32)
Creatinine, Ser: 0.83 mg/dL (ref 0.40–1.50)
GFR: 93.39 mL/min (ref >60.00)
Glucose, Bld: 100 mg/dL — ABNORMAL HIGH (ref 70–99)
Potassium: 5 mEq/L (ref 3.5–5.1)
Sodium: 136 mEq/L (ref 135–145)
TOTAL PROTEIN: 7.3 g/dL (ref 6.0–8.3)

## 2017-11-17 LAB — CBC
HEMATOCRIT: 45.9 % (ref 39.0–52.0)
HEMOGLOBIN: 15.5 g/dL (ref 13.0–17.0)
MCHC: 33.7 g/dL (ref 30.0–36.0)
MCV: 94 fl (ref 78.0–100.0)
PLATELETS: 274 10*3/uL (ref 150.0–400.0)
RBC: 4.89 Mil/uL (ref 4.22–5.81)
RDW: 14.1 % (ref 11.5–15.5)
WBC: 7.7 10*3/uL (ref 4.0–10.5)

## 2017-11-17 LAB — PSA, MEDICARE: PSA: 0 ng/ml — ABNORMAL LOW (ref 0.10–4.00)

## 2017-11-17 LAB — HEMOGLOBIN A1C: Hgb A1c MFr Bld: 6 % (ref 4.6–6.5)

## 2017-11-17 MED ORDER — ATORVASTATIN CALCIUM 40 MG PO TABS: 40.0000 mg | ORAL_TABLET | Freq: Every day | ORAL | 3 refills | Status: DC

## 2017-11-17 NOTE — Assessment & Plan Note (Signed)
Encouraged tobacco cessation.

## 2017-11-17 NOTE — Assessment & Plan Note (Signed)
Reports history of prostatectomy.  No recent follow-up on this.  I had a long discussion with him on whether or not this was necessary given his age and prior treatment though we decided to check on this to ensure that it is undetectable.

## 2017-11-17 NOTE — Assessment & Plan Note (Signed)
Chronic issue.  Has been fairly stable.  No acute changes.  We will get him to see physical therapy to help with gait and balance.

## 2017-11-17 NOTE — Assessment & Plan Note (Signed)
Pacemaker in place.  He will continue to follow with cardiology.

## 2017-11-17 NOTE — Patient Instructions (Signed)
Nice to meet you. We will check lab work today and contact you with the results. Please try to quit smoking. We will get you set up with physical therapy to help with your walking.

## 2017-11-17 NOTE — Progress Notes (Addendum)
Tommi Rumps, MD Phone: [phone]  Gary [name] is a 84 y.o. male who presents today for follow-up.  Hypertension: On metoprolol.  No chest pain, shortness of breath, or edema.  Has been well controlled.  A. fib: Taking Eliquis.  No bleeding issues.  No palpitations.  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  Sees cardiology for this.  Hyperlipidemia: Continues on Lipitor.  No right upper quadrant pain or myalgias.  Patient has had chronic issues with weakness in his legs and balance at times.  This has been going back over a year.  He has not done physical therapy recently.  Has been walking with a walker which has been beneficial.  He feels he gets around fairly good.  No acute changes.  Tobacco abuse: Smoking 7-8 cigarettes a day now.  He smoked 1 pack a day for a long time.  He is tried multiple things to quit smoking.  He thinks he might be willing to quit on his own.  Social History   Tobacco Use  Smoking Status Current Some Day Smoker  . Packs/day: 0.25  . Years: 40.00  . Pack years: 10.00  . Types: Cigarettes  Smokeless Tobacco Never Used     ROS see history of present illness  Objective  Physical Exam Vitals:   11/17/17 0826  BP: 100/60  Pulse: 66  Temp: (!) 96.4 F (35.8 C)  SpO2: 99%    BP Readings from Last 3 Encounters:  11/17/17 100/60  04/17/17 102/62  09/30/16 126/81   Wt Readings from Last 3 Encounters:  11/17/17 223 lb 12.8 oz (101.5 kg)  04/17/17 218 lb 12.8 oz (99.2 kg)  09/30/16 218 lb (98.9 kg)    Physical Exam  Constitutional: No distress.  HENT:  Head: Normocephalic and atraumatic.  Tongue appears normal, several small apparent collections of vessels underneath his tongue  Eyes: Conjunctivae are normal. Pupils are equal, round, and reactive to light.  Cardiovascular: Normal rate, regular rhythm and normal heart sounds.  Pulmonary/Chest: Effort normal and breath sounds normal.  Musculoskeletal: He  exhibits no edema.  Neurological: He is alert.  5/5 strength bilateral quads, hamstrings, plantar flexion, and dorsiflexion, sensation light touch intact bilateral lower extremities gait is slow and steady with use of walker  Skin: Skin is warm and dry. He is not diaphoretic.     Assessment/Plan: Please see individual problem list.  AF (paroxysmal atrial fibrillation) Sinus rhythm today.  He will continue to follow with cardiology.  Continue Eliquis.  Check CBC.  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  He will continue to follow with cardiology.  HTN (hypertension) Well-controlled.  Continue to monitor.  Weakness of both legs Chronic issue.  Has been fairly stable.  No acute changes.  We will get him to see physical therapy to help with gait and balance.  Hyperlipidemia Check lipid panel.  Continue Lipitor.  Tobacco abuse Encouraged tobacco cessation.  H/O malignant neoplasm of prostate Reports history of prostatectomy.  No recent follow-up on this.  I had a long discussion with him on whether or not this was necessary given his age and prior treatment though we decided to check on this to ensure that it is undetectable.  Offered ENT referral to look under tongue though patient's wife said she would take him to the dentist to have this evaluated.  Orders Placed This Encounter  Procedures  . Comp Met (CMET)  . Lipid panel  . HgB A1c  .  CBC  . PSA, Medicare  . Ambulatory referral to Physical Therapy    Referral Priority:   Routine    Referral Type:   Physical Medicine    Referral Reason:   Specialty Services Required    Requested Specialty:   Physical Therapy    Number of Visits Requested:   1    Meds ordered this encounter  Medications  . atorvastatin (LIPITOR) 40 MG tablet    Sig: Take 1 tablet (40 mg total) by mouth daily.    Dispense:  90 tablet    Refill:  [place], MD [place]

## 2017-11-17 NOTE — Assessment & Plan Note (Signed)
Sinus rhythm today.  He will continue to follow with cardiology.  Continue Eliquis.  Check CBC.

## 2017-11-17 NOTE — Assessment & Plan Note (Signed)
Check lipid panel.  Continue Lipitor.

## 2017-11-17 NOTE — Assessment & Plan Note (Signed)
Well-controlled. Continue to monitor. 

## 2018-01-11 ENCOUNTER — Emergency Department: Payer: Medicare Other

## 2018-01-11 ENCOUNTER — Emergency Department
Admission: EM | Admit: 2018-01-11 | Discharge: 2018-01-11 | Disposition: A | Discharge status: Home / Self Care | Payer: Medicare Other | Attending: Emergency Medicine | Admitting: Emergency Medicine

## 2018-01-11 ENCOUNTER — Other Ambulatory Visit: Payer: Self-pay

## 2018-01-11 ENCOUNTER — Encounter: Payer: Self-pay | Admitting: Emergency Medicine

## 2018-01-11 DIAGNOSIS — W19XXXA Unspecified fall, initial encounter: Secondary | ICD-10-CM | POA: Diagnosis not present

## 2018-01-11 DIAGNOSIS — Z950 Presence of cardiac pacemaker: Secondary | ICD-10-CM | POA: Insufficient documentation

## 2018-01-11 DIAGNOSIS — R531 Weakness: Secondary | ICD-10-CM | POA: Diagnosis not present

## 2018-01-11 DIAGNOSIS — I10 Essential (primary) hypertension: Secondary | ICD-10-CM | POA: Insufficient documentation

## 2018-01-11 DIAGNOSIS — F17210 Nicotine dependence, cigarettes, uncomplicated: Secondary | ICD-10-CM | POA: Insufficient documentation

## 2018-01-11 DIAGNOSIS — Z79899 Other long term (current) drug therapy: Secondary | ICD-10-CM | POA: Diagnosis not present

## 2018-01-11 LAB — URINALYSIS, COMPLETE (UACMP) WITH MICROSCOPIC
Bacteria, UA: NONE SEEN
Bilirubin Urine: NEGATIVE
Glucose, UA: NEGATIVE mg/dL
Hgb urine dipstick: NEGATIVE
Ketones, ur: NEGATIVE mg/dL
Leukocytes, UA: NEGATIVE
Nitrite: NEGATIVE
PROTEIN: NEGATIVE mg/dL
Specific Gravity, Urine: 1.013 (ref 1.005–1.030)
pH: 5 (ref 5.0–8.0)

## 2018-01-11 LAB — CBC WITH DIFFERENTIAL/PLATELET
Basophils Absolute: 0.1 10*3/uL (ref 0–0.1)
Basophils Relative: 1 %
EOS PCT: 7 %
Eosinophils Absolute: 0.5 10*3/uL (ref 0–0.7)
HCT: 42.7 % (ref 40.0–52.0)
Hemoglobin: 14.8 g/dL (ref 13.0–18.0)
LYMPHS ABS: 2.3 10*3/uL (ref 1.0–3.6)
LYMPHS PCT: 28 %
MCH: 32.1 pg (ref 26.0–34.0)
MCHC: 34.7 g/dL (ref 32.0–36.0)
MCV: 92.6 fL (ref 80.0–100.0)
Monocytes Absolute: 0.8 10*3/uL (ref 0.2–1.0)
Monocytes Relative: 10 %
Neutro Abs: 4.4 10*3/uL (ref 1.4–6.5)
Neutrophils Relative %: 54 %
PLATELETS: 238 10*3/uL (ref 150–440)
RBC: 4.62 MIL/uL (ref 4.40–5.90)
RDW: 14.2 % (ref 11.5–14.5)
WBC: 8.2 10*3/uL (ref 3.8–10.6)

## 2018-01-11 LAB — COMPREHENSIVE METABOLIC PANEL
ALT: 17 U/L (ref 17–63)
AST: 22 U/L (ref 15–41)
Albumin: 4 g/dL (ref 3.5–5.0)
Alkaline Phosphatase: 79 U/L (ref 38–126)
Anion gap: 10 (ref 5–15)
BUN: 17 mg/dL (ref 6–20)
CHLORIDE: 97 mmol/L — AB (ref 101–111)
CO2: 25 mmol/L (ref 22–32)
CREATININE: 0.8 mg/dL (ref 0.61–1.24)
Calcium: 9.2 mg/dL (ref 8.9–10.3)
GFR calc non Af Amer: >60 mL/min (ref >60)
Glucose, Bld: 100 mg/dL — ABNORMAL HIGH (ref 65–99)
Potassium: 4.2 mmol/L (ref 3.5–5.1)
SODIUM: 132 mmol/L — AB (ref 135–145)
Total Bilirubin: 0.8 mg/dL (ref 0.3–1.2)
Total Protein: 7.4 g/dL (ref 6.5–8.1)

## 2018-01-11 LAB — ETHANOL: Alcohol, Ethyl (B): 53 mg/dL — ABNORMAL HIGH (ref <10)

## 2018-01-11 LAB — CK: Total CK: 74 U/L (ref 49–397)

## 2018-01-11 NOTE — ED Provider Notes (Signed)
[facility] Emergency Department Provider Note   ____________________________________________   First MD Initiated Contact with Patient 01/11/18 1954     (approximate)  I have reviewed the triage vital signs and the nursing notes.   HISTORY  Chief Complaint Weakness and Fall    HPI Gary [name] is a 84 y.o. male Patient reports he tripped and fell trying to get in the house. EMS reports he had difficulty walking and picking his feet up off the ground. He has been weak for some time and does not want to use his walker. He denies passing out. He is taking eliquis though.he feels okay at present.EMS reports they smelled a strong odor of ethanol.   Past Medical History:  Diagnosis Date  . Atrial fibrillation ([place])   . AV block    s/p Pacemaker placement.  . Chicken pox   . History of blood transfusion   . Hyperlipidemia   . Hypertension     Patient Active Problem List   Diagnosis Date Noted  . Weakness of both legs 09/30/2016  . Tinea cruris 05/08/2016  . AV block, complete ([place]) 07/24/2015  . Tobacco abuse 07/24/2015  . HTN (hypertension) 07/24/2015  . Diminished pulses in lower extremity 07/24/2015  . Hyperlipidemia 07/23/2015  . H/O malignant neoplasm of prostate 05/08/2015  . AF (paroxysmal atrial fibrillation) ([place]) 04/08/2015    Past Surgical History:  Procedure Laterality Date  . HERNIA REPAIR    . KNEE ARTHROSCOPY    . PACEMAKER PLACEMENT    . PROSTATECTOMY      Prior to Admission medications   Medication Sig Start Date End Date Taking? Authorizing Provider  apixaban (ELIQUIS) 2.5 MG TABS tablet Take 1 tablet by mouth 2 (two) times daily.     [provider]  atorvastatin (LIPITOR) 40 MG tablet Take 1 tablet (40 mg total) by mouth daily. 11/17/17   Leone Haven, MD  metoprolol succinate (TOPROL-XL) 25 MG 24 hr tablet Take 12.5 mg by mouth daily.  02/12/15   Roselee Nova, MD    Allergies Patient has no known  allergies.  Family History  Problem Relation Age of Onset  . Cancer Father   . Heart disease Father   . Cancer Brother     Social History Social History   Tobacco Use  . Smoking status: Current Some Day Smoker    Packs/day: 0.25    Years: 40.00    Pack years: 10.00    Types: Cigarettes  . Smokeless tobacco: Never Used  Substance Use Topics  . Alcohol use: Yes    Alcohol/week: 1.2 oz    Types: 2 Standard drinks or equivalent per week  . Drug use: No    Review of Systems  Constitutional: No fever/chills Eyes: No visual changes. ENT: No sore throat. Cardiovascular: Denies chest pain. Respiratory: Denies shortness of breath. Gastrointestinal: No abdominal pain.  No nausea, no vomiting.  No diarrhea.  No constipation. Genitourinary: Negative for dysuria. Musculoskeletal: Negative for back pain. Skin: Negative for rash. Neurological: Negative for headaches, focal weakness   ____________________________________________   PHYSICAL EXAM:  VITAL SIGNS: ED Triage Vitals  Enc Vitals Group     BP 01/11/18 1950 (!) 141/76     Pulse Rate 01/11/18 1950 62     Resp 01/11/18 1950 20     Temp 01/11/18 1950 98.1 F (36.7 C)     Temp Source 01/11/18 1950 Oral     SpO2 01/11/18 1944 96 %  Weight 01/11/18 1950 225 lb (102.1 kg)     Height 01/11/18 1950 6\' 1"  (1.854 m)     Head Circumference --      Peak Flow --      Pain Score --      Pain Loc --      Pain Edu? --      Excl. in [place]? --     Constitutional: Alert and oriented. Well appearing and in no acute distress. Eyes: Conjunctivae are normal. PER. EOMI. Head: Atraumatic. Nose: No congestion/rhinnorhea. Mouth/Throat: Mucous membranes are moist.  Oropharynx non-erythematous. Neck: No stridor. Cardiovascular: Normal rate, regular rhythm. Grossly normal heart sounds.  Good peripheral circulation. Respiratory: Normal respiratory effort.  No retractions. Lungs CTAB. Gastrointestinal: Soft and nontender. No distention.  No abdominal bruits. No CVA tenderness. Musculoskeletal: No lower extremity tenderness nor edema.  No joint effusions. Neurologic:  Normal speech and language. No gross focal neurologic deficits are appreciated.motor strength in arms and legs is normal patient denies any numbness and cerebellar finger to nose and rapid alternating movements and hands are normal. Cranial nerves II through XII are intact of the visual fields were not checked Skin:  Skin is warm, dry and intact. No rash noted. Psychiatric: Mood and affect are normal. Speech and behavior are normal.  ____________________________________________   LABS (all labs ordered are listed, but only abnormal results are displayed)  Labs Reviewed  URINALYSIS, COMPLETE (UACMP) WITH MICROSCOPIC - Abnormal; Notable for the following components:      Result Value   Color, Urine YELLOW (*)    APPearance CLEAR (*)    Squamous Epithelial / LPF 0-5 (*)    All other components within normal limits  COMPREHENSIVE METABOLIC PANEL - Abnormal; Notable for the following components:   Sodium 132 (*)    Chloride 97 (*)    Glucose, Bld 100 (*)    All other components within normal limits  ETHANOL - Abnormal; Notable for the following components:   Alcohol, Ethyl (B) 53 (*)    All other components within normal limits  CBC WITH DIFFERENTIAL/PLATELET  CK  CBG MONITORING, ED   ____________________________________________  EKG  EKG shows a fully paced rhythm ____________________________________________  RADIOLOGY  ED MD interpretation: chest x-ray shows cardiomegaly pacemakers in place radiology's reading some fibrosis but no acute changes I reviewed the films and agree  CT of the head and neck reported as no acute disease except for spinal stenosis due to calcification the posterior ligament.  Official radiology report(s): Ct Head Wo Contrast  Result Date: 01/11/2018 CLINICAL DATA:  Tripped and fell at home going up the steps, generalized  weakness for some time, no loss of consciousness, history atrial fibrillation, hypertension, smoker EXAM: CT HEAD WITHOUT CONTRAST CT CERVICAL SPINE WITHOUT CONTRAST TECHNIQUE: Multidetector CT imaging of the head and cervical spine was performed following the standard protocol without intravenous contrast. Multiplanar CT image reconstructions of the cervical spine were also generated. COMPARISON:  09/30/2016 FINDINGS: CT HEAD FINDINGS Brain: Generalized atrophy. Normal ventricular morphology. No midline shift or mass effect. Small vessel chronic ischemic changes of deep cerebral white matter. No intracranial hemorrhage, mass lesion, evidence of acute infarction, or extra-axial fluid collection. Vascular: No hyperdense vessels Skull: Demineralized but intact Sinuses/Orbits: Clear Other: N/A CT CERVICAL SPINE FINDINGS Alignment: Normal Skull base and vertebrae: Diffuse osseous demineralization. Mild scattered facet degenerative changes. Vertebral body heights maintained. Disc space narrowing with endplate spur formation at C5-C6 and C6-C7. Calcified posterior longitudinal ligament at C5-C6. No fracture,  subluxation or bone destruction. Soft tissues and spinal canal: Prevertebral soft tissues normal thickness. AP narrowing of spinal canal at C5-C6 primarily due to calcification of the posterior longitudinal ligament. Disc levels:  As above Upper chest: Lung apices clear Other: Atherosclerotic calcifications at the carotid bifurcations bilaterally as well as the proximal great vessels. IMPRESSION: Atrophy with small vessel chronic ischemic changes of deep cerebral white matter. No acute intracranial abnormalities. Scattered degenerative disc and facet disease changes of the cervical spine. No acute cervical spine abnormalities. AP spinal stenosis at C5-C6 due primarily due to calcification of the posterior longitudinal ligament. Electronically Signed   By: Lavonia Dana M.D.   On: 01/11/2018 20:41   Ct Cervical Spine  Wo Contrast  Result Date: 01/11/2018 CLINICAL DATA:  Tripped and fell at home going up the steps, generalized weakness for some time, no loss of consciousness, history atrial fibrillation, hypertension, smoker EXAM: CT HEAD WITHOUT CONTRAST CT CERVICAL SPINE WITHOUT CONTRAST TECHNIQUE: Multidetector CT imaging of the head and cervical spine was performed following the standard protocol without intravenous contrast. Multiplanar CT image reconstructions of the cervical spine were also generated. COMPARISON:  09/30/2016 FINDINGS: CT HEAD FINDINGS Brain: Generalized atrophy. Normal ventricular morphology. No midline shift or mass effect. Small vessel chronic ischemic changes of deep cerebral white matter. No intracranial hemorrhage, mass lesion, evidence of acute infarction, or extra-axial fluid collection. Vascular: No hyperdense vessels Skull: Demineralized but intact Sinuses/Orbits: Clear Other: N/A CT CERVICAL SPINE FINDINGS Alignment: Normal Skull base and vertebrae: Diffuse osseous demineralization. Mild scattered facet degenerative changes. Vertebral body heights maintained. Disc space narrowing with endplate spur formation at C5-C6 and C6-C7. Calcified posterior longitudinal ligament at C5-C6. No fracture, subluxation or bone destruction. Soft tissues and spinal canal: Prevertebral soft tissues normal thickness. AP narrowing of spinal canal at C5-C6 primarily due to calcification of the posterior longitudinal ligament. Disc levels:  As above Upper chest: Lung apices clear Other: Atherosclerotic calcifications at the carotid bifurcations bilaterally as well as the proximal great vessels. IMPRESSION: Atrophy with small vessel chronic ischemic changes of deep cerebral white matter. No acute intracranial abnormalities. Scattered degenerative disc and facet disease changes of the cervical spine. No acute cervical spine abnormalities. AP spinal stenosis at C5-C6 due primarily due to calcification of the posterior  longitudinal ligament. Electronically Signed   By: Lavonia Dana M.D.   On: 01/11/2018 20:41   Dg Chest Portable 1 View  Result Date: 01/11/2018 CLINICAL DATA:  Fall EXAM: PORTABLE CHEST 1 VIEW COMPARISON:  11/11/2013 FINDINGS: Left-sided pacing device as before. Mild cardiomegaly. Diffuse coarse interstitial opacity suggesting fibrosis. No consolidation or effusion. Aortic atherosclerosis. No pneumothorax. IMPRESSION: Borderline to mild cardiomegaly. Probable fibrosis at the bilateral lung bases. No acute infiltrate, edema, pneumothorax or pleural effusion. Electronically Signed   By: Donavan Foil M.D.   On: 01/11/2018 20:14    ____________________________________________   PROCEDURES  Procedure(s) performed:   Procedures  Critical Care performed:   ____________________________________________   INITIAL IMPRESSION / ASSESSMENT AND PLAN / ED COURSE  patient is able to walk with assistance here in the emergency room. He normally wears         ____________________________________________   FINAL CLINICAL IMPRESSION(S) / ED DIAGNOSES  Final diagnoses:  Fall, initial encounter     ED Discharge Orders    None       Note:  This document was prepared using Dragon voice recognition software and may include unintentional dictation errors.    Nena Polio, MD 01/11/18  [id number]  

## 2018-01-11 NOTE — Discharge Instructions (Addendum)
be sure to usual walker. Follow-up with your cardiologist. Have your cardiologist or your regular doctor review the labs and CT report. There may be a little bit of spinal stenosis in your neck. It may be worthwhile to have that evaluated further. Also your sodium was slightly low. Should be followed up.

## 2018-01-11 NOTE — ED Triage Notes (Signed)
Pt bib [facility] from home following a trip and fall up the steps. Patient states he has been feeling generalized weakness for some time which causes complications with ambulating, patient does not like to use his walker. Patient states this is first fall. Denies LOC, did not hit head, denies any pain. Patient a&O. Has pacemaker. Takes metoprolol, eliquis, and atorvastatin. States drinks alcohol 2xweek.

## 2018-01-11 NOTE — ED Notes (Addendum)
Pt. Verbalizes understanding of d/c instructions, medications, and follow-up. VS stable and pain controlled per pt.  Pt. In NAD at time of d/c and denies further concerns regarding this visit. Pt. Stable at the time of departure from the unit, departing unit by the safest and most appropriate manner per that pt condition and limitations with all belongings accounted for. Pt advised to return to the ED at any time for emergent concerns, or for new/worsening symptoms.   Downtime signature paper signed for discharge during downtime.

## 2018-01-15 DIAGNOSIS — R001 Bradycardia, unspecified: Secondary | ICD-10-CM | POA: Insufficient documentation

## 2018-04-18 ENCOUNTER — Ambulatory Visit: Payer: Medicare Other

## 2018-06-20 ENCOUNTER — Other Ambulatory Visit: Payer: Self-pay

## 2018-06-20 ENCOUNTER — Emergency Department: Payer: Medicare Other

## 2018-06-20 ENCOUNTER — Observation Stay
Admission: EM | Admit: 2018-06-20 | Discharge: 2018-06-21 | Disposition: A | Discharge status: Home / Self Care | Payer: Medicare Other | Attending: Internal Medicine | Admitting: Internal Medicine

## 2018-06-20 DIAGNOSIS — Z7901 Long term (current) use of anticoagulants: Secondary | ICD-10-CM | POA: Insufficient documentation

## 2018-06-20 DIAGNOSIS — I672 Cerebral atherosclerosis: Secondary | ICD-10-CM | POA: Diagnosis not present

## 2018-06-20 DIAGNOSIS — R531 Weakness: Secondary | ICD-10-CM | POA: Insufficient documentation

## 2018-06-20 DIAGNOSIS — M8580 Other specified disorders of bone density and structure, unspecified site: Secondary | ICD-10-CM | POA: Insufficient documentation

## 2018-06-20 DIAGNOSIS — Z79899 Other long term (current) drug therapy: Secondary | ICD-10-CM | POA: Insufficient documentation

## 2018-06-20 DIAGNOSIS — R4182 Altered mental status, unspecified: Principal | ICD-10-CM | POA: Diagnosis present

## 2018-06-20 DIAGNOSIS — Y92009 Unspecified place in unspecified non-institutional (private) residence as the place of occurrence of the external cause: Secondary | ICD-10-CM | POA: Diagnosis not present

## 2018-06-20 DIAGNOSIS — J8410 Pulmonary fibrosis, unspecified: Secondary | ICD-10-CM | POA: Diagnosis not present

## 2018-06-20 DIAGNOSIS — Z950 Presence of cardiac pacemaker: Secondary | ICD-10-CM | POA: Diagnosis not present

## 2018-06-20 DIAGNOSIS — I708 Atherosclerosis of other arteries: Secondary | ICD-10-CM | POA: Diagnosis not present

## 2018-06-20 DIAGNOSIS — Z8249 Family history of ischemic heart disease and other diseases of the circulatory system: Secondary | ICD-10-CM | POA: Insufficient documentation

## 2018-06-20 DIAGNOSIS — S7001XA Contusion of right hip, initial encounter: Secondary | ICD-10-CM | POA: Diagnosis not present

## 2018-06-20 DIAGNOSIS — I119 Hypertensive heart disease without heart failure: Secondary | ICD-10-CM | POA: Diagnosis not present

## 2018-06-20 DIAGNOSIS — I700 Atherosclerosis of aorta: Secondary | ICD-10-CM | POA: Diagnosis not present

## 2018-06-20 DIAGNOSIS — F17210 Nicotine dependence, cigarettes, uncomplicated: Secondary | ICD-10-CM | POA: Insufficient documentation

## 2018-06-20 DIAGNOSIS — M25551 Pain in right hip: Secondary | ICD-10-CM | POA: Diagnosis not present

## 2018-06-20 DIAGNOSIS — Z9181 History of falling: Secondary | ICD-10-CM | POA: Insufficient documentation

## 2018-06-20 DIAGNOSIS — S0101XA Laceration without foreign body of scalp, initial encounter: Secondary | ICD-10-CM | POA: Insufficient documentation

## 2018-06-20 DIAGNOSIS — Z9079 Acquired absence of other genital organ(s): Secondary | ICD-10-CM | POA: Insufficient documentation

## 2018-06-20 DIAGNOSIS — M533 Sacrococcygeal disorders, not elsewhere classified: Secondary | ICD-10-CM | POA: Diagnosis not present

## 2018-06-20 DIAGNOSIS — I495 Sick sinus syndrome: Secondary | ICD-10-CM | POA: Insufficient documentation

## 2018-06-20 DIAGNOSIS — I482 Chronic atrial fibrillation: Secondary | ICD-10-CM | POA: Insufficient documentation

## 2018-06-20 DIAGNOSIS — W19XXXA Unspecified fall, initial encounter: Secondary | ICD-10-CM | POA: Insufficient documentation

## 2018-06-20 DIAGNOSIS — M160 Bilateral primary osteoarthritis of hip: Secondary | ICD-10-CM | POA: Diagnosis not present

## 2018-06-20 LAB — COMPREHENSIVE METABOLIC PANEL
ALK PHOS: 75 U/L (ref 38–126)
ALT: 16 U/L (ref 0–44)
AST: 23 U/L (ref 15–41)
Albumin: 4.1 g/dL (ref 3.5–5.0)
Anion gap: 8 (ref 5–15)
BUN: 15 mg/dL (ref 8–23)
CHLORIDE: 103 mmol/L (ref 98–111)
CO2: 23 mmol/L (ref 22–32)
CREATININE: 0.65 mg/dL (ref 0.61–1.24)
Calcium: 9.1 mg/dL (ref 8.9–10.3)
GFR calc Af Amer: >60 mL/min (ref >60)
GFR calc non Af Amer: >60 mL/min (ref >60)
Glucose, Bld: 110 mg/dL — ABNORMAL HIGH (ref 70–99)
Potassium: 4.7 mmol/L (ref 3.5–5.1)
Sodium: 134 mmol/L — ABNORMAL LOW (ref 135–145)
Total Bilirubin: 1 mg/dL (ref 0.3–1.2)
Total Protein: 7.2 g/dL (ref 6.5–8.1)

## 2018-06-20 LAB — CBC WITH DIFFERENTIAL/PLATELET
Basophils Absolute: 0.1 10*3/uL (ref 0–0.1)
Basophils Relative: 1 %
EOS ABS: 0.4 10*3/uL (ref 0–0.7)
EOS PCT: 3 %
HCT: 44.2 % (ref 40.0–52.0)
Hemoglobin: 15.2 g/dL (ref 13.0–18.0)
LYMPHS ABS: 2.4 10*3/uL (ref 1.0–3.6)
Lymphocytes Relative: 19 %
MCH: 32.3 pg (ref 26.0–34.0)
MCHC: 34.3 g/dL (ref 32.0–36.0)
MCV: 94.3 fL (ref 80.0–100.0)
MONOS PCT: 7 %
Monocytes Absolute: 0.9 10*3/uL (ref 0.2–1.0)
Neutro Abs: 8.5 10*3/uL — ABNORMAL HIGH (ref 1.4–6.5)
Neutrophils Relative %: 70 %
PLATELETS: 250 10*3/uL (ref 150–440)
RBC: 4.69 MIL/uL (ref 4.40–5.90)
RDW: 14.1 % (ref 11.5–14.5)
WBC: 12.1 10*3/uL — ABNORMAL HIGH (ref 3.8–10.6)

## 2018-06-20 LAB — TROPONIN I

## 2018-06-20 LAB — BRAIN NATRIURETIC PEPTIDE: B NATRIURETIC PEPTIDE 5: 156 pg/mL — AB (ref 0.0–100.0)

## 2018-06-20 MED ORDER — OCUVITE-LUTEIN PO CAPS
1.0000 | ORAL_CAPSULE | Freq: Two times a day (BID) | ORAL | Status: DC
  Administered 2018-06-21: 1 via ORAL
  Filled 2018-06-20 (×3): qty 1

## 2018-06-20 MED ORDER — ONDANSETRON HCL 4 MG/2ML IJ SOLN
INTRAMUSCULAR | Status: AC
  Filled 2018-06-20: qty 2

## 2018-06-20 MED ORDER — ONDANSETRON HCL 4 MG PO TABS: 4.0000 mg | ORAL_TABLET | Freq: Four times a day (QID) | ORAL | Status: DC | PRN

## 2018-06-20 MED ORDER — APIXABAN 2.5 MG PO TABS
2.5000 mg | ORAL_TABLET | Freq: Two times a day (BID) | ORAL | Status: DC
  Administered 2018-06-21: 2.5 mg via ORAL
  Filled 2018-06-20 (×2): qty 1

## 2018-06-20 MED ORDER — MORPHINE SULFATE (PF) 4 MG/ML IV SOLN
4.0000 mg | Freq: Once | INTRAVENOUS | Status: AC
  Administered 2018-06-20: 4 mg via INTRAVENOUS

## 2018-06-20 MED ORDER — ONDANSETRON HCL 4 MG/2ML IJ SOLN
4.0000 mg | Freq: Once | INTRAMUSCULAR | Status: AC
  Administered 2018-06-20: 4 mg via INTRAVENOUS

## 2018-06-20 MED ORDER — ACETAMINOPHEN 650 MG RE SUPP: 650.0000 mg | Freq: Four times a day (QID) | RECTAL | Status: DC | PRN

## 2018-06-20 MED ORDER — TRAMADOL HCL 50 MG PO TABS
50.0000 mg | ORAL_TABLET | Freq: Four times a day (QID) | ORAL | Status: DC | PRN
  Administered 2018-06-21 (×2): 50 mg via ORAL
  Filled 2018-06-20 (×2): qty 1

## 2018-06-20 MED ORDER — ACETAMINOPHEN 325 MG PO TABS
650.0000 mg | ORAL_TABLET | Freq: Four times a day (QID) | ORAL | Status: DC | PRN
  Administered 2018-06-21: 650 mg via ORAL
  Filled 2018-06-20: qty 2

## 2018-06-20 MED ORDER — MORPHINE SULFATE (PF) 4 MG/ML IV SOLN
INTRAVENOUS | Status: AC
  Filled 2018-06-20: qty 1

## 2018-06-20 MED ORDER — LORAZEPAM 2 MG/ML IJ SOLN
0.5000 mg | Freq: Once | INTRAMUSCULAR | Status: AC
  Administered 2018-06-20: 0.5 mg via INTRAVENOUS
  Filled 2018-06-20: qty 1

## 2018-06-20 MED ORDER — ONDANSETRON HCL 4 MG/2ML IJ SOLN: 4.0000 mg | Freq: Four times a day (QID) | INTRAMUSCULAR | Status: DC | PRN

## 2018-06-20 NOTE — ED Notes (Signed)
Pt attempted to urinate with ED tech. Tech reported to RN that pt unable to urinate at this time

## 2018-06-20 NOTE — ED Triage Notes (Signed)
Pt arrives via ems from home. Ems states pt seen at cardiologist today when stepping into house pt lost balance and fell back onto bottom. Pt complains sacral and rt hip pain. Ems states pt unable to bear wight on right leg. Small lac on top right head, currently not bleeding. NAD at this time pt able to bend and move legs

## 2018-06-20 NOTE — H&P (Signed)
[facility] [place] NAME: Gary [name]    MR#:  [id number]  DATE OF BIRTH:  [birth year]  DATE OF ADMISSION:  06/20/2018  PRIMARY CARE PHYSICIAN: Leone Haven, MD   REQUESTING/REFERRING PHYSICIAN: Dr. Conni Slipper  CHIEF COMPLAINT:   Chief Complaint  Patient presents with  . Fall    HISTORY OF PRESENT ILLNESS:  Gary [name]  is a 84 y.o. male with a known history of chronic atrial fibrillation status post pacemaker, hypertension, hyperlipidemia who presents to the hospital after a fall at his home on his driveway.  Patient was in his usual state of health and doing well and came back from his cardiologist office today at home and fell near his driveway.  Patient lost his footing but did not lose his consciousness or have any prodromal symptoms prior to his fall.  Patient presented to the ER and underwent a CT head which was negative for acute pathology, he was also complaining of right hip pain and had CT of his pelvis which is negative for acute fracture.  Patient still is quite lethargic and weak and is having significant pain at right hip and patient's wife says she cannot take care of him at home in the state that he is in and therefore hospitalist services were contacted for admission.  PAST MEDICAL HISTORY:   Past Medical History:  Diagnosis Date  . Atrial fibrillation ([place])   . AV block    s/p Pacemaker placement.  . Chicken pox   . History of blood transfusion   . Hyperlipidemia   . Hypertension     PAST SURGICAL HISTORY:   Past Surgical History:  Procedure Laterality Date  . HERNIA REPAIR    . KNEE ARTHROSCOPY    . PACEMAKER PLACEMENT    . PROSTATECTOMY      SOCIAL HISTORY:   Social History   Tobacco Use  . Smoking status: Current Some Day Smoker    Packs/day: 0.25    Years: 40.00    Pack years: 10.00    Types: Cigarettes  . Smokeless tobacco: Never Used  Substance Use Topics  . Alcohol use: Yes     Alcohol/week: 2.0 standard drinks    Types: 2 Standard drinks or equivalent per week    FAMILY HISTORY:   Family History  Problem Relation Age of Onset  . Cancer Father   . Heart disease Father   . Cancer Brother     DRUG ALLERGIES:  No Known Allergies  REVIEW OF SYSTEMS:   Review of Systems  Constitutional: Negative for fever and weight loss.  HENT: Negative for congestion, nosebleeds and tinnitus.   Eyes: Negative for blurred vision, double vision and redness.  Respiratory: Negative for cough, hemoptysis and shortness of breath.   Cardiovascular: Negative for chest pain, orthopnea, leg swelling and PND.  Gastrointestinal: Negative for abdominal pain, diarrhea, melena, nausea and vomiting.  Genitourinary: Negative for dysuria, hematuria and urgency.  Musculoskeletal: Positive for falls (Right HIp) and joint pain.  Neurological: Negative for dizziness, tingling, sensory change, focal weakness, seizures, weakness and headaches.  Endo/Heme/Allergies: Negative for polydipsia. Does not bruise/bleed easily.  Psychiatric/Behavioral: Negative for depression and memory loss. The patient is not nervous/anxious.     MEDICATIONS AT HOME:   Prior to Admission medications   Medication Sig Start Date End Date Taking? Authorizing Provider  apixaban (ELIQUIS) 2.5 MG TABS tablet Take 1 tablet by mouth 2 (two) times daily.    Yes  [provider]  Multiple Vitamins-Minerals (PRESERVISION AREDS 2+MULTI VIT PO) Take 1 tablet by mouth 2 (two) times daily.   Yes [provider]  atorvastatin (LIPITOR) 40 MG tablet Take 1 tablet (40 mg total) by mouth daily. Patient not taking: Reported on 06/20/2018 11/17/17   Leone Haven, MD      VITAL SIGNS:  Blood pressure (!) 158/85, pulse 73, temperature 98 F (36.7 C), temperature source Oral, resp. rate 20, height 6' 1.5" (1.867 m), weight 99.8 kg, SpO2 93 %.  PHYSICAL EXAMINATION:  Physical Exam  GENERAL:  84 y.o.-year-old  patient lying in the bed with no acute distress.  EYES: Pupils equal, round, reactive to light and accommodation. No scleral icterus. Extraocular muscles intact.  HEENT: Head atraumatic, normocephalic. Oropharynx and nasopharynx clear. No oropharyngeal erythema, moist oral mucosa  NECK:  Supple, no jugular venous distention. No thyroid enlargement, no tenderness.  LUNGS: Normal breath sounds bilaterally, no wheezing, rales, rhonchi. No use of accessory muscles of respiration.  CARDIOVASCULAR: S1, S2 RRR. No murmurs, rubs, gallops, clicks.  ABDOMEN: Soft, nontender, nondistended. Bowel sounds present. No organomegaly or mass.  EXTREMITIES: No pedal edema, cyanosis, or clubbing. + 2 pedal & radial pulses b/l.   NEUROLOGIC: Cranial nerves II through XII are intact. No focal Motor or sensory deficits appreciated b/l. Globally weak.  PSYCHIATRIC: The patient is alert and oriented x 3.  SKIN: No obvious rash, lesion, or ulcer.   LABORATORY PANEL:   CBC Recent Labs  Lab 06/20/18 1711  WBC 12.1*  HGB 15.2  HCT 44.2  PLT 250   ------------------------------------------------------------------------------------------------------------------  Chemistries  Recent Labs  Lab 06/20/18 1711  NA 134*  K 4.7  CL 103  CO2 23  GLUCOSE 110*  BUN 15  CREATININE 0.65  CALCIUM 9.1  AST 23  ALT 16  ALKPHOS 75  BILITOT 1.0   ------------------------------------------------------------------------------------------------------------------  Cardiac Enzymes Recent Labs  Lab 06/20/18 1711  TROPONINI <0.03   ------------------------------------------------------------------------------------------------------------------  RADIOLOGY:  Ct Head Wo Contrast  Result Date: 06/20/2018 CLINICAL DATA:  Post fall, now with laceration to the top of the head. EXAM: CT HEAD WITHOUT CONTRAST TECHNIQUE: Contiguous axial images were obtained from the base of the skull through the vertex without intravenous  contrast. COMPARISON:  01/11/2018; 09/30/2016 FINDINGS: Examination is degraded secondary to patient motion necessitating the acquisition of additional images. Brain: Re-demonstrated advanced atrophy with sulcal prominence centralized volume loss with commensurate ex vacuo dilatation of the ventricular system. Confluent periventricular hypodensities are unchanged and compatible with microvascular ischemic disease. Given background parenchymal abnormalities as well as patient motion artifact, there is no CT evidence of superimposed acute large territory infarct. No intraparenchymal or extra-axial mass or hemorrhage. Unchanged size and configuration of the ventricles and the basilar cisterns. No midline shift. Vascular: Intracranial atherosclerosis. Skull: No displaced calvarial fracture. Sinuses/Orbits: Limited visualization of the paranasal sinuses and mastoid air cells is normal. No air-fluid levels. Other: Regional soft tissues appear normal with special attention paid to the vertex of the scalp. No radiopaque foreign body. No subcutaneous emphysema. IMPRESSION: Similar findings of atrophy and microvascular ischemic disease without superimposed acute intracranial process on this motion degraded examination. Electronically Signed   By: Sandi Mariscal M.D.   On: 06/20/2018 16:10   Ct Pelvis Wo Contrast  Result Date: 06/20/2018 CLINICAL DATA:  [place].  Unable to bear weight on RIGHT leg. EXAM: CT PELVIS WITHOUT CONTRAST TECHNIQUE: Multidetector CT imaging of the pelvis was performed following the standard protocol without  intravenous contrast. COMPARISON:  RIGHT hip radiograph June 12, 2018 at 1646 hours FINDINGS: Urinary Tract: Urinary bladder is well distended and normal in appearance. Bowel: Included small and large bowel are normal in course and caliber. Mild colonic diverticulosis. Normal appendix. Vascular/Lymphatic: Moderate aortoiliac calcific atherosclerosis. Reproductive:  S/p prostatectomy and  seminal vesicle resection. Other: Anterior pelvic wall scarring. No intraperitoneal free fluid or free air. Musculoskeletal: No acute fracture. No dislocation. Mild bilateral hip osteoarthrosis. Osteopenia. Moderate RIGHT greater LEFT sacroiliac osteoarthrosis. Symmetric appearance of pelvic muscles. No definite joint effusion by noncontrast CT. Degenerative change of the lumbar spine resulting in moderate LEFT L4-5 and moderate RIGHT L5-S1 neural foraminal narrowing. IMPRESSION: 1. No acute fracture.  Mild bilateral hip osteoarthrosis. 2. Status post prostatectomy. Aortic Atherosclerosis (ICD10-I70.0). Electronically Signed   By: Elon Alas M.D.   On: 06/20/2018 17:02   Dg Chest Portable 1 View  Result Date: 06/20/2018 CLINICAL DATA:  Initial evaluation for acute altered mental status. EXAM: PORTABLE CHEST 1 VIEW COMPARISON:  Prior radiograph from 01/11/2018. FINDINGS: Left-sided transvenous pacemaker/AICD in place, stable. Cardiomegaly unchanged. Mediastinal silhouette normal. Aortic atherosclerosis. Lungs mildly hypoinflated with probable underlying fibrotic lung changes. Mildly increased pulmonary vascular congestion with interstitial prominence, suggesting a degree of superimposed pulmonary interstitial congestion/edema. No consolidative airspace disease. No pleural effusion. No pneumothorax. No acute osseous abnormality. IMPRESSION: 1. Chronic fibrotic lung changes with superimposed mild diffuse pulmonary interstitial congestion/edema. 2. Stable cardiomegaly with aortic atherosclerosis. Electronically Signed   By: Jeannine Boga M.D.   On: 06/20/2018 17:38   Dg Hip Unilat W Or Wo Pelvis 2-3 Views Left  Result Date: 06/20/2018 CLINICAL DATA:  Post fall, now with hip and sacral pain. EXAM: DG HIP (WITH OR WITHOUT PELVIS) 2-3V LEFT COMPARISON:  Right hip radiographs-earlier same day FINDINGS: No definite fracture or dislocation. Mild degenerative change of the left hip with joint space loss,  subchondral sclerosis and osteophytosis. No evidence of avascular necrosis. Surgical clips overlie the lower pelvis likely the sequela of prior prostatectomy. Vascular calcifications. No radiopaque foreign body. IMPRESSION: 1. No acute findings. 2. Mild degenerative change of the left hip. Electronically Signed   By: Sandi Mariscal M.D.   On: 06/20/2018 16:14   Dg Hip Unilat W Or Wo Pelvis 2-3 Views Right  Result Date: 06/20/2018 CLINICAL DATA:  Post fall, now with bilateral hip and sacral pain EXAM: DG HIP (WITH OR WITHOUT PELVIS) 2-3V RIGHT COMPARISON:  Left hip radiographs-earlier same day FINDINGS: No definite fracture or dislocation. Mild degenerative change of the right hip with joint space loss, subchondral sclerosis and osteophytosis. No evidence avascular necrosis. Surgical clips overlie the lower pelvis likely the sequela of prior prostatectomy. Vascular calcifications. No radiopaque foreign body. IMPRESSION: 1. No acute findings. 2. Mild degenerative change the right hip. Electronically Signed   By: Sandi Mariscal M.D.   On: 06/20/2018 16:12     IMPRESSION AND PLAN:   84 year old male with past medical history of atrial fibrillation status post pacemaker, hypertension, hyperlipidemia who presents to the hospital after a fall and noted to have right hip pain.  1.  Right hip pain-secondary to a fall and contusion.  Patient has no evidence of fracture based on his CT scan of his pelvis and also his x-rays. - Continue supportive care with pain control with oral tramadol for now.  Patient received some IV morphine in the ER.  He has become quite lethargic after the IV morphine and therefore will avoid any further high-dose narcotics for now. -  We will get physical therapy consult to assess mobility.  2.  Status post recent fall and now generalized weakness-as mentioned we will get physical therapy consult to assess mobility.  Patient has chronic lower extremity weakness and would benefit from physical  therapy. -We will get care management consult to assess for possible home health services.  3.  History of chronic atrial fibrillation-rate controlled.  Patient is status post pacemaker. - Continue Eliquis.    All the records are reviewed and case discussed with ED provider. Management plans discussed with the patient, family and they are in agreement.  CODE STATUS: Full code  TOTAL TIME TAKING CARE OF THIS PATIENT: 40 minutes.    Henreitta Leber M.D on 06/20/2018 at 7:35 PM  Between 7am to 6pm - Pager - [phone]  After 6pm go to [email or web address] - password [email or web address]  [facility]  Office  [phone]  CC: Primary care physician; Leone Haven, MD

## 2018-06-20 NOTE — ED Notes (Signed)
Pt extremely restless and enable to get comfortable in bed

## 2018-06-20 NOTE — ED Notes (Signed)
Patient transported to CT 

## 2018-06-20 NOTE — ED Provider Notes (Signed)
[facility] Emergency Department Provider Note   ____________________________________________   First MD Initiated Contact with Patient 06/20/18 1522     (approximate)  I have reviewed the triage vital signs and the nursing notes.   HISTORY  Chief Complaint Fall    HP Gary [name] is a 84 y.o. male who is coming home from his cardiologist got inside the house with one foot and fell.  He apparently lost his balance.  He did not pass out.  He cut his head and complains of pain in his hips and pelvis.  Pain is moderately severe but there is no pain on palpation.  Accident just happened.  He is fallen before.  Past Medical History:  Diagnosis Date  . Atrial fibrillation ([place])   . AV block    s/p Pacemaker placement.  . Chicken pox   . History of blood transfusion   . Hyperlipidemia   . Hypertension     Patient Active Problem List   Diagnosis Date Noted  . Weakness of both legs 09/30/2016  . Tinea cruris 05/08/2016  . AV block, complete ([place]) 07/24/2015  . Tobacco abuse 07/24/2015  . HTN (hypertension) 07/24/2015  . Diminished pulses in lower extremity 07/24/2015  . Hyperlipidemia 07/23/2015  . H/O malignant neoplasm of prostate 05/08/2015  . AF (paroxysmal atrial fibrillation) ([place]) 04/08/2015    Past Surgical History:  Procedure Laterality Date  . HERNIA REPAIR    . KNEE ARTHROSCOPY    . PACEMAKER PLACEMENT    . PROSTATECTOMY      Prior to Admission medications   Medication Sig Start Date End Date Taking? Authorizing Provider  apixaban (ELIQUIS) 2.5 MG TABS tablet Take 1 tablet by mouth 2 (two) times daily.    Yes [provider]  Multiple Vitamins-Minerals (PRESERVISION AREDS 2+MULTI VIT PO) Take 1 tablet by mouth 2 (two) times daily.   Yes [provider]  atorvastatin (LIPITOR) 40 MG tablet Take 1 tablet (40 mg total) by mouth daily. Patient not taking: Reported on 06/20/2018 11/17/17   Leone Haven, MD     Allergies Patient has no known allergies.  Family History  Problem Relation Age of Onset  . Cancer Father   . Heart disease Father   . Cancer Brother     Social History Social History   Tobacco Use  . Smoking status: Current Some Day Smoker    Packs/day: 0.25    Years: 40.00    Pack years: 10.00    Types: Cigarettes  . Smokeless tobacco: Never Used  Substance Use Topics  . Alcohol use: Yes    Alcohol/week: 2.0 standard drinks    Types: 2 Standard drinks or equivalent per week  . Drug use: No    Review of Systems  Constitutional: No fever/chills Eyes: No visual changes. ENT: No sore throat. Cardiovascular: Denies chest pain. Respiratory: Denies shortness of breath. Gastrointestinal: No abdominal pain.  No nausea, no vomiting.  No diarrhea.  No constipation. Genitourinary: Negative for dysuria. Musculoskeletal: Negative for back pain. Skin: Negative for rash. Neurological: Negative for headaches, focal weakness   ____________________________________________   PHYSICAL EXAM:  VITAL SIGNS: ED Triage Vitals  Enc Vitals Group     BP      Pulse      Resp      Temp      Temp src      SpO2      Weight      Height  Head Circumference      Peak Flow      Pain Score      Pain Loc      Pain Edu?      Excl. in [place]?     Constitutional: Alert and oriented. Well appearing and in no acute distress. Eyes: Conjunctivae are normal.  Head: Atraumatic except for bleeding abrasion on the right upper forehead. Nose: No congestion/rhinnorhea. Mouth/Throat: Mucous membranes are moist.  Oropharynx non-erythematous. Neck: No stridor.  No cervical spine tenderness to palpation. Cardiovascular: Normal rate, regular rhythm. Grossly normal heart sounds.  Good peripheral circulation. Respiratory: Normal respiratory effort.  No retractions. Lungs CTAB. Gastrointestinal: Soft and nontender. No distention. No abdominal bruits. No CVA tenderness there is some tenderness to  palpation over the pubic bone.. Musculoskeletal: No lower extremity tenderness nor edema.  No pain on palpation of either hip Neurologic:  Normal speech and language. No gross focal neurologic deficits are appreciated.  EMS reports patient can bear partial weight but not full weight Skin:  Skin is warm, dry and intact. No rash noted. Psychiatric: Mood and affect are normal. Speech and behavior are normal.  ____________________________________________   LABS (all labs ordered are listed, but only abnormal results are displayed)  Labs Reviewed  COMPREHENSIVE METABOLIC PANEL - Abnormal; Notable for the following components:      Result Value   Sodium 134 (*)    Glucose, Bld 110 (*)    All other components within normal limits  CBC WITH DIFFERENTIAL/PLATELET - Abnormal; Notable for the following components:   WBC 12.1 (*)    Neutro Abs 8.5 (*)    All other components within normal limits  BRAIN NATRIURETIC PEPTIDE - Abnormal; Notable for the following components:   B Natriuretic Peptide 156.0 (*)    All other components within normal limits  TROPONIN I  URINALYSIS, COMPLETE (UACMP) WITH MICROSCOPIC  URINE DRUG SCREEN, QUALITATIVE ([facility] ONLY)   ____________________________________________  EKG EKG read and interpreted by me shows A. fib possibly a flutter mostly paced rhythm no acute ST-T wave changes are seen there is also right bundle  ____________________________________________  RADIOLOGY  ED MD interpretation:  Official radiology report(s): Ct Head Wo Contrast  Result Date: 06/20/2018 CLINICAL DATA:  Post fall, now with laceration to the top of the head. EXAM: CT HEAD WITHOUT CONTRAST TECHNIQUE: Contiguous axial images were obtained from the base of the skull through the vertex without intravenous contrast. COMPARISON:  01/11/2018; 09/30/2016 FINDINGS: Examination is degraded secondary to patient motion necessitating the acquisition of additional images. Brain:  Re-demonstrated advanced atrophy with sulcal prominence centralized volume loss with commensurate ex vacuo dilatation of the ventricular system. Confluent periventricular hypodensities are unchanged and compatible with microvascular ischemic disease. Given background parenchymal abnormalities as well as patient motion artifact, there is no CT evidence of superimposed acute large territory infarct. No intraparenchymal or extra-axial mass or hemorrhage. Unchanged size and configuration of the ventricles and the basilar cisterns. No midline shift. Vascular: Intracranial atherosclerosis. Skull: No displaced calvarial fracture. Sinuses/Orbits: Limited visualization of the paranasal sinuses and mastoid air cells is normal. No air-fluid levels. Other: Regional soft tissues appear normal with special attention paid to the vertex of the scalp. No radiopaque foreign body. No subcutaneous emphysema. IMPRESSION: Similar findings of atrophy and microvascular ischemic disease without superimposed acute intracranial process on this motion degraded examination. Electronically Signed   By: Sandi Mariscal M.D.   On: 06/20/2018 16:10   Ct Pelvis Wo Contrast  Result Date: 06/20/2018  CLINICAL DATA:  [place] at home.  Unable to bear weight on RIGHT leg. EXAM: CT PELVIS WITHOUT CONTRAST TECHNIQUE: Multidetector CT imaging of the pelvis was performed following the standard protocol without intravenous contrast. COMPARISON:  RIGHT hip radiograph June 12, 2018 at 1646 hours FINDINGS: Urinary Tract: Urinary bladder is well distended and normal in appearance. Bowel: Included small and large bowel are normal in course and caliber. Mild colonic diverticulosis. Normal appendix. Vascular/Lymphatic: Moderate aortoiliac calcific atherosclerosis. Reproductive:  S/p prostatectomy and seminal vesicle resection. Other: Anterior pelvic wall scarring. No intraperitoneal free fluid or free air. Musculoskeletal: No acute fracture. No dislocation. Mild  bilateral hip osteoarthrosis. Osteopenia. Moderate RIGHT greater LEFT sacroiliac osteoarthrosis. Symmetric appearance of pelvic muscles. No definite joint effusion by noncontrast CT. Degenerative change of the lumbar spine resulting in moderate LEFT L4-5 and moderate RIGHT L5-S1 neural foraminal narrowing. IMPRESSION: 1. No acute fracture.  Mild bilateral hip osteoarthrosis. 2. Status post prostatectomy. Aortic Atherosclerosis (ICD10-I70.0). Electronically Signed   By: Elon Alas M.D.   On: 06/20/2018 17:02   Dg Chest Portable 1 View  Result Date: 06/20/2018 CLINICAL DATA:  Initial evaluation for acute altered mental status. EXAM: PORTABLE CHEST 1 VIEW COMPARISON:  Prior radiograph from 01/11/2018. FINDINGS: Left-sided transvenous pacemaker/AICD in place, stable. Cardiomegaly unchanged. Mediastinal silhouette normal. Aortic atherosclerosis. Lungs mildly hypoinflated with probable underlying fibrotic lung changes. Mildly increased pulmonary vascular congestion with interstitial prominence, suggesting a degree of superimposed pulmonary interstitial congestion/edema. No consolidative airspace disease. No pleural effusion. No pneumothorax. No acute osseous abnormality. IMPRESSION: 1. Chronic fibrotic lung changes with superimposed mild diffuse pulmonary interstitial congestion/edema. 2. Stable cardiomegaly with aortic atherosclerosis. Electronically Signed   By: Jeannine Boga M.D.   On: 06/20/2018 17:38   Dg Hip Unilat W Or Wo Pelvis 2-3 Views Left  Result Date: 06/20/2018 CLINICAL DATA:  Post fall, now with hip and sacral pain. EXAM: DG HIP (WITH OR WITHOUT PELVIS) 2-3V LEFT COMPARISON:  Right hip radiographs-earlier same day FINDINGS: No definite fracture or dislocation. Mild degenerative change of the left hip with joint space loss, subchondral sclerosis and osteophytosis. No evidence of avascular necrosis. Surgical clips overlie the lower pelvis likely the sequela of prior prostatectomy.  Vascular calcifications. No radiopaque foreign body. IMPRESSION: 1. No acute findings. 2. Mild degenerative change of the left hip. Electronically Signed   By: Sandi Mariscal M.D.   On: 06/20/2018 16:14   Dg Hip Unilat W Or Wo Pelvis 2-3 Views Right  Result Date: 06/20/2018 CLINICAL DATA:  Post fall, now with bilateral hip and sacral pain EXAM: DG HIP (WITH OR WITHOUT PELVIS) 2-3V RIGHT COMPARISON:  Left hip radiographs-earlier same day FINDINGS: No definite fracture or dislocation. Mild degenerative change of the right hip with joint space loss, subchondral sclerosis and osteophytosis. No evidence avascular necrosis. Surgical clips overlie the lower pelvis likely the sequela of prior prostatectomy. Vascular calcifications. No radiopaque foreign body. IMPRESSION: 1. No acute findings. 2. Mild degenerative change the right hip. Electronically Signed   By: Sandi Mariscal M.D.   On: 06/20/2018 16:12    ____________________________________________   PROCEDURES  Procedure(s) performed:   Procedures  Critical Care performed:   ____________________________________________   INITIAL IMPRESSION / ASSESSMENT AND PLAN / ED COURSE Patient is very restless and remains restless complaining of pain over the right SI joint.  This area CT read and shows no evidence of any injury.  The pain remains.  Patient given some morphine twice but still complains of pain I tried some  Ativan to see if it would relax and just half a milligram really did not do anything.  He did vomit once immediately after getting the Ativan.  He continues to deny headache.  He continues to have all of the looks his extremities move equally and well with no lack of coordination.  His pupils are normal and reactive.  CT of the head was negative earlier.     Clinical Course as of Jun 20 1914  Wed Jun 20, 2018  1743 Brain natriuretic peptide [PM]    Clinical Course User Index [PM] Nena Polio, MD      ____________________________________________   FINAL CLINICAL IMPRESSION(S) / ED DIAGNOSES  Final diagnoses:  Fall, initial encounter  Altered mental status, unspecified altered mental status type     ED Discharge Orders    None       Note:  This document was prepared using Dragon voice recognition software and may include unintentional dictation errors.    Nena Polio, MD 06/20/18 [phone]

## 2018-06-20 NOTE — ED Notes (Addendum)
Pt trying to climb out of the end of the bed. Redirected by RN and family back in bed and instructed not to attempt to get out of bed without staff assistance

## 2018-06-20 NOTE — ED Notes (Signed)
Pt continues to toss and turn in bed unable to get comfortable. Pt states pains in back feels like muscle spasms. MD notified

## 2018-06-21 LAB — URINALYSIS, COMPLETE (UACMP) WITH MICROSCOPIC
BILIRUBIN URINE: NEGATIVE
Bacteria, UA: NONE SEEN
GLUCOSE, UA: NEGATIVE mg/dL
HGB URINE DIPSTICK: NEGATIVE
Ketones, ur: NEGATIVE mg/dL
LEUKOCYTES UA: NEGATIVE
NITRITE: NEGATIVE
PROTEIN: NEGATIVE mg/dL
Specific Gravity, Urine: 1.019 (ref 1.005–1.030)
Squamous Epithelial / LPF: NONE SEEN (ref 0–5)
pH: 5 (ref 5.0–8.0)

## 2018-06-21 LAB — URINE DRUG SCREEN, QUALITATIVE (ARMC ONLY)
Amphetamines, Ur Screen: NOT DETECTED
Barbiturates, Ur Screen: NOT DETECTED
Cannabinoid 50 Ng, Ur ~~LOC~~: NOT DETECTED
Cocaine Metabolite,Ur ~~LOC~~: NOT DETECTED
MDMA (Ecstasy)Ur Screen: NOT DETECTED
Methadone Scn, Ur: NOT DETECTED
Opiate, Ur Screen: POSITIVE — AB
PHENCYCLIDINE (PCP) UR S: NOT DETECTED
Tricyclic, Ur Screen: NOT DETECTED

## 2018-06-21 MED ORDER — TRAMADOL HCL 50 MG PO TABS: 50.0000 mg | ORAL_TABLET | Freq: Four times a day (QID) | ORAL | 0 refills | Status: DC | PRN

## 2018-06-21 NOTE — Plan of Care (Signed)

## 2018-06-21 NOTE — Plan of Care (Signed)
  Problem: Education: Goal: Knowledge of General Education information will improve Description Including pain rating scale, medication(s)/side effects and non-pharmacologic comfort measures Outcome: Progressing   

## 2018-06-21 NOTE — Discharge Instructions (Signed)
HHPT °Fall precaution. °

## 2018-06-21 NOTE — Care Management (Signed)
Met with wife at bedside. Patient remains confused. She is taking him home by car. PT recommending HHPT.Wife has no agency preference when presented with home health list. Referral to [facility] for [place]. He has a walker.

## 2018-06-21 NOTE — Progress Notes (Signed)
Discharge instructions and prescriptions given to pt and pt's wife. IV removed. Pt dressed and ready to discharge home with wife.

## 2018-06-21 NOTE — Discharge Summary (Addendum)
[facility] NAME: Gary [name]    MR#:  [id number]  DATE OF BIRTH:  05/18/1930  DATE OF ADMISSION:  06/20/2018   ADMITTING PHYSICIAN: Henreitta Leber, MD  DATE OF DISCHARGE: 06/21/2018 12:15 PM  PRIMARY CARE PHYSICIAN: Leone Haven, MD   ADMISSION DIAGNOSIS:  Fall, initial encounter [phone]] Altered mental status, unspecified altered mental status type [R41.82] DISCHARGE DIAGNOSIS:  Active Problems:   Altered mental status  SECONDARY DIAGNOSIS:   Past Medical History:  Diagnosis Date  . Atrial fibrillation ([place])   . AV block    s/p Pacemaker placement.  . Chicken pox   . History of blood transfusion   . Hyperlipidemia   . Hypertension    HOSPITAL COURSE:   84 year old male with past medical history of atrial fibrillation status post pacemaker, hypertension, hyperlipidemia who presents to the hospital after a fall and noted to have right hip pain.  1.  Right hip pain-secondary to a fall and contusion.  Patient has no evidence of fracture based on his CT scan of his pelvis and also his x-rays. - Continue supportive care with pain control with oral tramadol.  Patient received some IV morphine in the ER.  He has become quite lethargic after the IV morphine and therefore will avoid any further high-dose narcotics for now. Advised the patient to avoid pain medication if possible.  2.  Status post recent fall and now generalized weaknessPT elevation suggest home health and PT.  3.  History of chronic atrial fibrillation-rate controlled.  Patient is status post pacemaker. - Continue Eliquis.  Tobacco abuse.  Smoking cessation was counseled for 3 to 4 minutes.  DISCHARGE CONDITIONS:  Stable, discharge to home with home health and PT today. CONSULTS OBTAINED:   DRUG ALLERGIES:  No Known Allergies DISCHARGE MEDICATIONS:   Allergies as of 06/21/2018   No Known Allergies     Medication List    TAKE these  medications   atorvastatin 40 MG tablet Commonly known as:  LIPITOR Take 1 tablet (40 mg total) by mouth daily.   ELIQUIS 2.5 MG Tabs tablet Generic drug:  apixaban Take 1 tablet by mouth 2 (two) times daily.   PRESERVISION AREDS 2+MULTI VIT PO Take 1 tablet by mouth 2 (two) times daily.   traMADol 50 MG tablet Commonly known as:  ULTRAM Take 1 tablet (50 mg total) by mouth every 6 (six) hours as needed for moderate pain or severe pain.        DISCHARGE INSTRUCTIONS:  See AVS.   If you experience worsening of your admission symptoms, develop shortness of breath, life threatening emergency, suicidal or homicidal thoughts you must seek medical attention immediately by calling 911 or calling your MD immediately  if symptoms less severe.  You Must read complete instructions/literature along with all the possible adverse reactions/side effects for all the Medicines you take and that have been prescribed to you. Take any new Medicines after you have completely understood and accpet all the possible adverse reactions/side effects.   Please note  You were cared for by a hospitalist during your hospital stay. If you have any questions about your discharge medications or the care you received while you were in the hospital after you are discharged, you can call the unit and asked to speak with the hospitalist on call if the hospitalist that took care of you is not available. Once you are discharged, your primary care physician will handle  any further medical issues. Please note that NO REFILLS for any discharge medications will be authorized once you are discharged, as it is imperative that you return to your primary care physician (or establish a relationship with a primary care physician if you do not have one) for your aftercare needs so that they can reassess your need for medications and monitor your lab values.    On the day of Discharge:  VITAL SIGNS:  Blood pressure 116/81, pulse 71,  temperature 98 F (36.7 C), temperature source Oral, resp. rate 17, height 6\' 1"  (1.854 m), weight 92.5 kg, SpO2 95 %. PHYSICAL EXAMINATION:  GENERAL:  84 y.o.-year-old patient lying in the bed with no acute distress.  EYES: Pupils equal, round, reactive to light and accommodation. No scleral icterus. Extraocular muscles intact.  HEENT: Head atraumatic, normocephalic. Oropharynx and nasopharynx clear.  NECK:  Supple, no jugular venous distention. No thyroid enlargement, no tenderness.  LUNGS: Normal breath sounds bilaterally, no wheezing, rales,rhonchi or crepitation. No use of accessory muscles of respiration.  CARDIOVASCULAR: S1, S2 normal. No murmurs, rubs, or gallops.  ABDOMEN: Soft, non-tender, non-distended. Bowel sounds present. No organomegaly or mass.  EXTREMITIES: No pedal edema, cyanosis, or clubbing.  NEUROLOGIC: Cranial nerves II through XII are intact. Muscle strength 4/5 in all extremities. Sensation intact. Gait not checked.  PSYCHIATRIC: The patient is alert and oriented x 3.  SKIN: No obvious rash, lesion, or ulcer.  DATA REVIEW:   CBC Recent Labs  Lab 06/20/18 1711  WBC 12.1*  HGB 15.2  HCT 44.2  PLT 250    Chemistries  Recent Labs  Lab 06/20/18 1711  NA 134*  K 4.7  CL 103  CO2 23  GLUCOSE 110*  BUN 15  CREATININE 0.65  CALCIUM 9.1  AST 23  ALT 16  ALKPHOS 75  BILITOT 1.0     Microbiology Results  No results found for this or any previous visit.  RADIOLOGY:  Ct Head Wo Contrast  Result Date: 06/20/2018 CLINICAL DATA:  Post fall, now with laceration to the top of the head. EXAM: CT HEAD WITHOUT CONTRAST TECHNIQUE: Contiguous axial images were obtained from the base of the skull through the vertex without intravenous contrast. COMPARISON:  01/11/2018; 09/30/2016 FINDINGS: Examination is degraded secondary to patient motion necessitating the acquisition of additional images. Brain: Re-demonstrated advanced atrophy with sulcal prominence centralized  volume loss with commensurate ex vacuo dilatation of the ventricular system. Confluent periventricular hypodensities are unchanged and compatible with microvascular ischemic disease. Given background parenchymal abnormalities as well as patient motion artifact, there is no CT evidence of superimposed acute large territory infarct. No intraparenchymal or extra-axial mass or hemorrhage. Unchanged size and configuration of the ventricles and the basilar cisterns. No midline shift. Vascular: Intracranial atherosclerosis. Skull: No displaced calvarial fracture. Sinuses/Orbits: Limited visualization of the paranasal sinuses and mastoid air cells is normal. No air-fluid levels. Other: Regional soft tissues appear normal with special attention paid to the vertex of the scalp. No radiopaque foreign body. No subcutaneous emphysema. IMPRESSION: Similar findings of atrophy and microvascular ischemic disease without superimposed acute intracranial process on this motion degraded examination. Electronically Signed   By: Sandi Mariscal M.D.   On: 06/20/2018 16:10   Ct Pelvis Wo Contrast  Result Date: 06/20/2018 CLINICAL DATA:  [place] at home.  Unable to bear weight on RIGHT leg. EXAM: CT PELVIS WITHOUT CONTRAST TECHNIQUE: Multidetector CT imaging of the pelvis was performed following the standard protocol without intravenous contrast. COMPARISON:  RIGHT hip  radiograph June 12, 2018 at 1646 hours FINDINGS: Urinary Tract: Urinary bladder is well distended and normal in appearance. Bowel: Included small and large bowel are normal in course and caliber. Mild colonic diverticulosis. Normal appendix. Vascular/Lymphatic: Moderate aortoiliac calcific atherosclerosis. Reproductive:  S/p prostatectomy and seminal vesicle resection. Other: Anterior pelvic wall scarring. No intraperitoneal free fluid or free air. Musculoskeletal: No acute fracture. No dislocation. Mild bilateral hip osteoarthrosis. Osteopenia. Moderate RIGHT greater LEFT  sacroiliac osteoarthrosis. Symmetric appearance of pelvic muscles. No definite joint effusion by noncontrast CT. Degenerative change of the lumbar spine resulting in moderate LEFT L4-5 and moderate RIGHT L5-S1 neural foraminal narrowing. IMPRESSION: 1. No acute fracture.  Mild bilateral hip osteoarthrosis. 2. Status post prostatectomy. Aortic Atherosclerosis (ICD10-I70.0). Electronically Signed   By: Elon Alas M.D.   On: 06/20/2018 17:02   Dg Chest Portable 1 View  Result Date: 06/20/2018 CLINICAL DATA:  Initial evaluation for acute altered mental status. EXAM: PORTABLE CHEST 1 VIEW COMPARISON:  Prior radiograph from 01/11/2018. FINDINGS: Left-sided transvenous pacemaker/AICD in place, stable. Cardiomegaly unchanged. Mediastinal silhouette normal. Aortic atherosclerosis. Lungs mildly hypoinflated with probable underlying fibrotic lung changes. Mildly increased pulmonary vascular congestion with interstitial prominence, suggesting a degree of superimposed pulmonary interstitial congestion/edema. No consolidative airspace disease. No pleural effusion. No pneumothorax. No acute osseous abnormality. IMPRESSION: 1. Chronic fibrotic lung changes with superimposed mild diffuse pulmonary interstitial congestion/edema. 2. Stable cardiomegaly with aortic atherosclerosis. Electronically Signed   By: Jeannine Boga M.D.   On: 06/20/2018 17:38   Dg Hip Unilat W Or Wo Pelvis 2-3 Views Left  Result Date: 06/20/2018 CLINICAL DATA:  Post fall, now with hip and sacral pain. EXAM: DG HIP (WITH OR WITHOUT PELVIS) 2-3V LEFT COMPARISON:  Right hip radiographs-earlier same day FINDINGS: No definite fracture or dislocation. Mild degenerative change of the left hip with joint space loss, subchondral sclerosis and osteophytosis. No evidence of avascular necrosis. Surgical clips overlie the lower pelvis likely the sequela of prior prostatectomy. Vascular calcifications. No radiopaque foreign body. IMPRESSION: 1. No acute  findings. 2. Mild degenerative change of the left hip. Electronically Signed   By: Sandi Mariscal M.D.   On: 06/20/2018 16:14   Dg Hip Unilat W Or Wo Pelvis 2-3 Views Right  Result Date: 06/20/2018 CLINICAL DATA:  Post fall, now with bilateral hip and sacral pain EXAM: DG HIP (WITH OR WITHOUT PELVIS) 2-3V RIGHT COMPARISON:  Left hip radiographs-earlier same day FINDINGS: No definite fracture or dislocation. Mild degenerative change of the right hip with joint space loss, subchondral sclerosis and osteophytosis. No evidence avascular necrosis. Surgical clips overlie the lower pelvis likely the sequela of prior prostatectomy. Vascular calcifications. No radiopaque foreign body. IMPRESSION: 1. No acute findings. 2. Mild degenerative change the right hip. Electronically Signed   By: Sandi Mariscal M.D.   On: 06/20/2018 16:12     Management plans discussed with the patient, his wife and they are in agreement.  CODE STATUS: Full Code   TOTAL TIME TAKING CARE OF THIS PATIENT: 32 minutes.    Demetrios Loll M.D on 06/21/2018 at 1:37 PM  Between 7am to 6pm - Pager - [phone]  After 6pm go to [email or web address] - Technical [employer]wer [facility]  Office  [phone]  CC: Primary care physician; Leone Haven, MD   Note: This dictation was prepared with Dragon dictation along with smaller phrase technology. Any transcriptional errors that result from this process are unintentional.

## 2018-06-21 NOTE — Evaluation (Signed)
Physical Therapy Evaluation Patient Details Name: Gary [name] MRN: [id number] DOB: 08-Jul-1930 Today's Date: 06/21/2018   History of Present Illness  Patient is an 84 year old male admitted with AMD and c/o back and hip pain following a fall in home.  PMH includes Htn, HLD, AV block and atrial fibrillation.  Clinical Impression  Patient is an 84 year old male who lives in a one story home with his wife.  He is independent with use of RW at baseline.  Pt able to perform bed mobility mod I and sit at EOB with fair balance, affected by back pain.  Pt able to stand from standard height bedside with CGA due to pt appearing unsteady on feet.  PT provided VC's for use of RW and body mechanics throughout evaluation.  He was able to ambulate 20 ft in room with RW and manual and verbal assistance for clearance of obstacles and some redirection.  Pt took several breaks during ambulation and reported increase in low back pain.  He also reported that he has difficulty with vision due to macular degeneration.  Pt presented with fair to good overall strength of LE's and UE's though some MMT was limited by pain.  Pt will continue to benefit from skilled PT with focus on balance and fall prevention, proper use of RW, safe functional mobility and HEP.    Follow Up Recommendations Home health PT;Supervision for mobility/OOB    Equipment Recommendations  None recommended by PT    Recommendations for Other Services       Precautions / Restrictions Precautions Precautions: Fall Precaution Comments: High Fall risk Restrictions Weight Bearing Restrictions: No      Mobility  Bed Mobility Overal bed mobility: Modified Independent             General bed mobility comments: Pt able to get to EOB with HOB slightly elevated and with increased time.  Transfers Overall transfer level: Needs assistance Equipment used: Rolling walker (2 wheeled) Transfers: Sit to/from Stand Sit to Stand: Min guard         General transfer comment: Pt appears slightly unsteady on feet but is able to rise without assistance.  Ambulation/Gait Ambulation/Gait assistance: Min assist Gait Distance (Feet): 20 Feet       Gait velocity interpretation: 1.31 - 2.62 ft/sec, indicative of limited community ambulator General Gait Details: Low to mod foot clearance, took several rest breaks and reported pain increase, requires VC's and manual assistance to clear obstacles due to pt reported macular degeneration.  Stairs            Wheelchair Mobility    Modified Rankin (Stroke Patients Only)       Balance Overall balance assessment: Needs assistance Sitting-balance support: Bilateral upper extremity supported Sitting balance-Leahy Scale: Fair   Postural control: Posterior lean Standing balance support: Bilateral upper extremity supported Standing balance-Leahy Scale: Fair                               Pertinent Vitals/Pain Pain Assessment: Faces Faces Pain Scale: Hurts a little bit Pain Location: Low back.  Pt did not respond when asked if hip still hurts. Pain Intervention(s): Limited activity within patient's tolerance    Home Living Family/patient expects to be discharged to:: Private residence Living Arrangements: Spouse/significant other Available Help at Discharge: Family;Available 24 hours/day Type of Home: House Home Access: Stairs to enter   [employer] of Steps: 1 Home Layout:  One level Home Equipment: Grab bars - tub/shower      Prior Function Level of Independence: Independent with assistive device(s)         Comments: Pt uses a RW for home ambulation.  Wife drives pt.     Hand Dominance        Extremity/Trunk Assessment   Upper Extremity Assessment Upper Extremity Assessment: Overall WFL for tasks assessed(Grossly 4/5 bilaterally.)    Lower Extremity Assessment Lower Extremity Assessment: Overall WFL for tasks assessed(Grossly 4+/5  bilaterally.)    Cervical / Trunk Assessment Cervical / Trunk Assessment: Normal  Communication   Communication: No difficulties  Cognition Arousal/Alertness: Awake/alert Behavior During Therapy: WFL for tasks assessed/performed Overall Cognitive Status: Within Functional Limits for tasks assessed                                 General Comments: Follows directions consistently.      General Comments      Exercises Other Exercises Other Exercises: Discussed what to expect with [facility] PT and importance/management of HEP. x4 min Other Exercises: Assistance with mobility, management of RW and body mechanics during STS. x 4 min   Assessment/Plan    PT Assessment Patient needs continued PT services  PT Problem List Decreased mobility;Decreased balance;Decreased knowledge of use of DME;Decreased activity tolerance       PT Treatment Interventions DME instruction;Therapeutic activities;Cognitive remediation;Gait training;Therapeutic exercise;Patient/family education;Stair training;Balance training;Functional mobility training;Neuromuscular re-education    PT Goals (Current goals can be found in the Care Plan section)  Acute Rehab PT Goals Patient Stated Goal: To return home and back to general activity level. PT Goal Formulation: With patient/family Time For Goal Achievement: 07/05/18 Potential to Achieve Goals: Good    Frequency Min 2X/week   Barriers to discharge        Co-evaluation               AM-PAC PT "6 Clicks" Daily Activity  Outcome Measure Difficulty turning over in bed (including adjusting bedclothes, sheets and blankets)?: A Little Difficulty moving from lying on back to sitting on the side of the bed? : A Little Difficulty sitting down on and standing up from a chair with arms (e.g., wheelchair, bedside commode, etc,.)?: A Little Help needed moving to and from a bed to chair (including a wheelchair)?: A Little Help needed walking in  hospital room?: A Little Help needed climbing 3-5 steps with a railing? : A Little 6 Click Score: 18    End of Session Equipment Utilized During Treatment: Gait belt Activity Tolerance: Patient tolerated treatment well Patient left: in chair;with chair alarm set Nurse Communication: Mobility status PT Visit Diagnosis: Unsteadiness on feet (R26.81);History of falling (Z91.81)    Time: 1610-9604 PT Time Calculation (min) (ACUTE ONLY): 21 min   Charges:   PT Evaluation $PT Eval Low Complexity: 1 Low PT Treatments $Therapeutic Activity: 8-22 mins        Roxanne Gates, PT, DPT  Roxanne Gates 06/21/2018, 8:49 AM

## 2018-06-22 ENCOUNTER — Telehealth: Payer: Self-pay

## 2018-06-22 NOTE — Telephone Encounter (Signed)
Transition Care Management Follow-up Telephone Call   Date discharged?06/21/18   How have you been since you were released from the hospital? Doing ok.  Sore on the R side of waist due to the fall. Pain is managed with medication. Eating/drinking without issues. Resting well. Denies dizziness, chest pain, head ache, confusion.   Do you understand why you were in the hospital? Yes, fall.   Do you understand the discharge instructions? Yes, follow up with pcp, HHPT.   Where were you discharged to? Home.   Items Reviewed:  Medications reviewed: YES  Allergies reviewed: Yes, NKA  Dietary changes reviewed: Yes  Referrals reviewed: Yes   Functional Questionnaire:   Activities of Daily Living (ADLs):   He states they are independent in the following: All except ambulating.  States they require assistance with the following: Ambulates with walker   Any transportation issues/concerns?: None.     Any patient concerns? Soreness on the right side of waist.    Confirmed importance and date/time of follow-up visits scheduled Yes, 06/26/18 at 1130.   Provider Appointment booked with Dr. Caryl Bis, pcp.   Confirmed with patient if condition begins to worsen call PCP or go to the ER.  Patient was given the office number and encouraged to call back with question or concerns.  : Yes.

## 2018-06-26 ENCOUNTER — Inpatient Hospital Stay: Payer: Medicare Other | Admitting: Family Medicine

## 2018-06-27 ENCOUNTER — Encounter: Payer: Self-pay | Admitting: Family Medicine

## 2018-06-27 ENCOUNTER — Ambulatory Visit: Payer: Medicare Other | Admitting: Family Medicine

## 2018-06-27 VITALS — BP 118/68 | HR 63 | Temp 97.8°F | Ht 73.0 in | Wt 155.0 lb

## 2018-06-27 DIAGNOSIS — I480 Paroxysmal atrial fibrillation: Secondary | ICD-10-CM

## 2018-06-27 DIAGNOSIS — M25551 Pain in right hip: Secondary | ICD-10-CM | POA: Insufficient documentation

## 2018-06-27 DIAGNOSIS — Z9181 History of falling: Secondary | ICD-10-CM | POA: Insufficient documentation

## 2018-06-27 DIAGNOSIS — Z7409 Other reduced mobility: Secondary | ICD-10-CM

## 2018-06-27 DIAGNOSIS — Z9989 Dependence on other enabling machines and devices: Secondary | ICD-10-CM | POA: Diagnosis not present

## 2018-06-27 NOTE — Patient Instructions (Signed)
Great to meet you!  You can use tylenol for mild to moderate pain and tramadol for more moderate to severe pain

## 2018-06-27 NOTE — Progress Notes (Signed)
Subjective:    Patient ID: Gary [name], male    DOB: [birth year], 84 y.o.   MRN: [id number]  HPI   Patient presents to clinic for hospital follow up, admitting Dx were fall, right hip pain secondary to recent fall and contusion, Altered mental status.  Currently he is feeling somewhat better, still having pain in right hip mainly in the groin.   Wife denies any more confusion -- He was given IV morphine in the ER and this made him lethargic. He was discharged to home with tramadol and also has been using tylenol as needed.   Wife states they have not been contacted by home health yet to set up home health PT services.  Patient has AFIB, takes eliquis  H&P and discharge summary reviewed by me  HISTORY OF PRESENT ILLNESS:  Gary [name]  is a 84 y.o. male with a known history of chronic atrial fibrillation status post pacemaker, hypertension, hyperlipidemia who presents to the hospital after a fall at his home on his driveway.  Patient was in his usual state of health and doing well and came back from his cardiologist office today at home and fell near his driveway.  Patient lost his footing but did not lose his consciousness or have any prodromal symptoms prior to his fall.  Patient presented to the ER and underwent a CT head which was negative for acute pathology, he was also complaining of right hip pain and had CT of his pelvis which is negative for acute fracture.  Patient still is quite lethargic and weak and is having significant pain at right hip and patient's wife says she cannot take care of him at home in the state that he is in and therefore hospitalist services were contacted for admission.  IMPRESSION AND PLAN:   84 year old male with past medical history of atrial fibrillation status post pacemaker, hypertension, hyperlipidemia who presents to the hospital after a fall and noted to have right hip pain.  1.  Right hip pain-secondary to a fall and contusion.  Patient has no  evidence of fracture based on his CT scan of his pelvis and also his x-rays. - Continue supportive care with pain control with oral tramadol for now.  Patient received some IV morphine in the ER.  He has become quite lethargic after the IV morphine and therefore will avoid any further high-dose narcotics for now. -We will get physical therapy consult to assess mobility.  2.  Status post recent fall and now generalized weakness-as mentioned we will get physical therapy consult to assess mobility.  Patient has chronic lower extremity weakness and would benefit from physical therapy. -We will get care management consult to assess for possible home health services.  3.  History of chronic atrial fibrillation-rate controlled.  Patient is status post pacemaker. - Continue Eliquis.   HOSPITAL COURSE:   84 year old male with past medical history of atrial fibrillation status post pacemaker, hypertension, hyperlipidemia who presents to the hospital after a fall and noted to have right hip pain.  1. Right hip pain-secondary to a fall and contusion. Patient has no evidence of fracture based on his CT scan of his pelvis and also his x-rays. - Continue supportive care with pain control with oral tramadol. Patient received some IV morphine in the ER. He has become quite lethargic after the IV morphine and therefore will avoid any further high-dose narcotics for now. Advised the patient to avoid pain medication if possible.  2. Status post recent  falland now generalized weaknessPT elevation suggest home health and PT.  3. History of chronic atrial fibrillation-rate controlled. Patient is status post pacemaker. - Continue Eliquis.   Radiology done in hospital -- Reviewed by me RADIOLOGY:  Ct Head Wo Contrast  Result Date: 06/20/2018 CLINICAL DATA:  Post fall, now with laceration to the top of the head. EXAM: CT HEAD WITHOUT CONTRAST TECHNIQUE: Contiguous axial images were obtained from  the base of the skull through the vertex without intravenous contrast. COMPARISON:  01/11/2018; 09/30/2016 FINDINGS: Examination is degraded secondary to patient motion necessitating the acquisition of additional images. Brain: Re-demonstrated advanced atrophy with sulcal prominence centralized volume loss with commensurate ex vacuo dilatation of the ventricular system. Confluent periventricular hypodensities are unchanged and compatible with microvascular ischemic disease. Given background parenchymal abnormalities as well as patient motion artifact, there is no CT evidence of superimposed acute large territory infarct. No intraparenchymal or extra-axial mass or hemorrhage. Unchanged size and configuration of the ventricles and the basilar cisterns. No midline shift. Vascular: Intracranial atherosclerosis. Skull: No displaced calvarial fracture. Sinuses/Orbits: Limited visualization of the paranasal sinuses and mastoid air cells is normal. No air-fluid levels. Other: Regional soft tissues appear normal with special attention paid to the vertex of the scalp. No radiopaque foreign body. No subcutaneous emphysema. IMPRESSION: Similar findings of atrophy and microvascular ischemic disease without superimposed acute intracranial process on this motion degraded examination. Electronically Signed   By: Sandi Mariscal M.D.   On: 06/20/2018 16:10   CT Pelvis Wo Contrast  Result Date: 06/20/2018 CLINICAL DATA:  [place] at home.  Unable to bear weight on RIGHT leg. EXAM: CT PELVIS WITHOUT CONTRAST TECHNIQUE: Multidetector CT imaging of the pelvis was performed following the standard protocol without intravenous contrast. COMPARISON:  RIGHT hip radiograph June 12, 2018 at 1646 hours FINDINGS: Urinary Tract: Urinary bladder is well distended and normal in appearance. Bowel: Included small and large bowel are normal in course and caliber. Mild colonic diverticulosis. Normal appendix. Vascular/Lymphatic: Moderate aortoiliac  calcific atherosclerosis. Reproductive:  S/p prostatectomy and seminal vesicle resection. Other: Anterior pelvic wall scarring. No intraperitoneal free fluid or free air. Musculoskeletal: No acute fracture. No dislocation. Mild bilateral hip osteoarthrosis. Osteopenia. Moderate RIGHT greater LEFT sacroiliac osteoarthrosis. Symmetric appearance of pelvic muscles. No definite joint effusion by noncontrast CT. Degenerative change of the lumbar spine resulting in moderate LEFT L4-5 and moderate RIGHT L5-S1 neural foraminal narrowing. IMPRESSION: 1. No acute fracture.  Mild bilateral hip osteoarthrosis. 2. Status post prostatectomy. Aortic Atherosclerosis (ICD10-I70.0). Electronically Signed   By: Elon Alas M.D.   On: 06/20/2018 17:02   DG Chest Portable 1 View  Result Date: 06/20/2018 CLINICAL DATA:  Initial evaluation for acute altered mental status. EXAM: PORTABLE CHEST 1 VIEW COMPARISON:  Prior radiograph from 01/11/2018. FINDINGS: Left-sided transvenous pacemaker/AICD in place, stable. Cardiomegaly unchanged. Mediastinal silhouette normal. Aortic atherosclerosis. Lungs mildly hypoinflated with probable underlying fibrotic lung changes. Mildly increased pulmonary vascular congestion with interstitial prominence, suggesting a degree of superimposed pulmonary interstitial congestion/edema. No consolidative airspace disease. No pleural effusion. No pneumothorax. No acute osseous abnormality. IMPRESSION: 1. Chronic fibrotic lung changes with superimposed mild diffuse pulmonary interstitial congestion/edema. 2. Stable cardiomegaly with aortic atherosclerosis. Electronically Signed   By: Jeannine Boga M.D.   On: 06/20/2018 17:38   DG Hip Unilat W Or Wo Pelvis 2-3 Views Left  Result Date: 06/20/2018 CLINICAL DATA:  Post fall, now with hip and sacral pain. EXAM: DG HIP (WITH OR WITHOUT PELVIS) 2-3V LEFT COMPARISON:  Right hip  radiographs-earlier same day FINDINGS: No definite fracture or  dislocation. Mild degenerative change of the left hip with joint space loss, subchondral sclerosis and osteophytosis. No evidence of avascular necrosis. Surgical clips overlie the lower pelvis likely the sequela of prior prostatectomy. Vascular calcifications. No radiopaque foreign body. IMPRESSION: 1. No acute findings. 2. Mild degenerative change of the left hip. Electronically Signed   By: Sandi Mariscal M.D.   On: 06/20/2018 16:14   DG Hip Unilat W Or Wo Pelvis 2-3 Views Right  Result Date: 06/20/2018 CLINICAL DATA:  Post fall, now with bilateral hip and sacral pain EXAM: DG HIP (WITH OR WITHOUT PELVIS) 2-3V RIGHT COMPARISON:  Left hip radiographs-earlier same day FINDINGS: No definite fracture or dislocation. Mild degenerative change of the right hip with joint space loss, subchondral sclerosis and osteophytosis. No evidence avascular necrosis. Surgical clips overlie the lower pelvis likely the sequela of prior prostatectomy. Vascular calcifications. No radiopaque foreign body. IMPRESSION: 1. No acute findings. 2. Mild degenerative change the right hip. Electronically Signed   By: Sandi Mariscal M.D.   On: 06/20/2018 16:12      LPN called patient for TCM phone call & note is reviewed by me: Transition Care Management Follow-up Telephone Call   Date discharged: 06/21/18   How have you been since you were released from the hospital? Doing ok.  Sore on the R side of waist due to the fall. Pain is managed with medication. Eating/drinking without issues. Resting well. Denies dizziness, chest pain, head ache, confusion.   Do you understand why you were in the hospital? Yes, fall.   Do you understand the discharge instructions? Yes, follow up with pcp, HHPT.   Where were you discharged to? Home.   Items Reviewed:  Medications reviewed: YES  Allergies reviewed: Yes, NKA  Dietary changes reviewed: Yes  Referrals reviewed: Yes   Functional Questionnaire:   Activities of Daily  Living (ADLs):   He states they are independent in the following: All except ambulating.  States they require assistance with the following: Ambulates with walker   Any transportation issues/concerns?: None.     Any patient concerns? Soreness on the right side of waist.    Review of Systems  Constitutional: Negative for chills, fatigue and fever.  HENT: Negative for congestion, ear pain, sinus pain and sore throat.   Eyes: Negative.   Respiratory: Negative for cough, shortness of breath and wheezing.   Cardiovascular: Negative for chest pain, palpitations and leg swelling.  Gastrointestinal: Negative for abdominal pain, diarrhea, nausea and vomiting.  Genitourinary: Negative for dysuria, frequency and urgency.  Musculoskeletal: Pain in right hip/groin area s/p fall. Skin: Negative for color change, pallor and rash.  Neurological: Negative for syncope, light-headedness and headaches.  Psychiatric/Behavioral: The patient is not nervous/anxious.       Objective:   Physical Exam  Constitutional: He is oriented to person, place, and time. He appears well-developed and well-nourished. No distress.  HENT:  Head: Normocephalic and atraumatic.  Eyes: No scleral icterus.  Cardiovascular: Normal rate.  Irregular rhythm. No LE edema.   Pulmonary/Chest: Effort normal and breath sounds normal. No respiratory distress. He has no wheezes. He has no rales.  Musculoskeletal: He exhibits tenderness.  Walks with walker. +tenderness right groin, pain when lifting leg past 90 degree angle. No pain with adduction or abduction of hip.  Neurological: He is alert and oriented to person, place, and time.  Nursing note and vitals reviewed.   Vitals:   06/27/18 0930  BP: 118/68  Pulse: 63  Temp: 97.8 F (36.6 C)  SpO2: 94%      Assessment & Plan:    Right hip pain status post fall -- Home Health referral was reccomned by case management RN in hospital, note from RN in epic about this.  [facility] called by Kerin Salen LPN today and [facility] told her they never received a referral for this patient.  Patient & wife advised that there is no referral was placed on patient getting contacted by home health soon.  Patient walks with walker short distances, uses wheelchair for longer distances.  Patient and wife both advised that physical therapy will be helpful for gait training, stair use training, transfer training. Patient advised he and wife can use Tylenol as needed for mild to moderate pain and can use tramadol as needed for moderate to more severe pain.   Altered mental status --altered mental status diagnosis from hospital admission appears to have been related to narcotic medication given her pain management.  Patient is alert and oriented in office visit, per wife patient is acting his normal self.  Follow-up in 4 weeks after working with physical therapy.  Return to clinic sooner if any issues arise.

## 2018-07-10 ENCOUNTER — Inpatient Hospital Stay: Payer: Medicare Other | Admitting: Family Medicine

## 2018-07-16 ENCOUNTER — Encounter: Payer: Self-pay | Admitting: Family Medicine

## 2018-07-16 ENCOUNTER — Ambulatory Visit: Payer: Medicare Other | Admitting: Family Medicine

## 2018-07-16 VITALS — BP 108/60 | HR 62 | Temp 97.6°F | Ht 73.0 in

## 2018-07-16 DIAGNOSIS — Z9181 History of falling: Secondary | ICD-10-CM | POA: Diagnosis not present

## 2018-07-16 DIAGNOSIS — M25551 Pain in right hip: Secondary | ICD-10-CM

## 2018-07-16 DIAGNOSIS — Z9989 Dependence on other enabling machines and devices: Secondary | ICD-10-CM | POA: Diagnosis not present

## 2018-07-16 DIAGNOSIS — Z7409 Other reduced mobility: Secondary | ICD-10-CM | POA: Diagnosis not present

## 2018-07-16 DIAGNOSIS — K4090 Unilateral inguinal hernia, without obstruction or gangrene, not specified as recurrent: Secondary | ICD-10-CM

## 2018-07-16 NOTE — Progress Notes (Signed)
Subjective:    Patient ID: Gary [name], male    DOB: 09/02/1930, 84 y.o.   MRN: [id number]  HPI   Patient presents in clinic to follow-up on her right hip pain status post fall and hospital stay in August 2019.  He was seen on 06/27/2018 for post hospital visit follow-up.  He was feeling somewhat improved, but had not yet started physical therapy at that time.  He has been doing PT at home and this has been going well. Mobility and transfers have improved. Uses walker to get around, walks short distances. When it is longer distances he will use a wheelchair.   He does c/o of a "lump" in right groin area, bothers him from time to time. It is not painful, rather it is described as "annoying".   Patient Active Problem List   Diagnosis Date Noted  . Uses walker 06/27/2018  . Decreased ambulation status 06/27/2018  . Right hip pain 06/27/2018  . History of recent fall 06/27/2018  . Altered mental status 06/20/2018  . Weakness of both legs 09/30/2016  . Tinea cruris 05/08/2016  . AV block, complete ([place]) 07/24/2015  . Tobacco abuse 07/24/2015  . HTN (hypertension) 07/24/2015  . Diminished pulses in lower extremity 07/24/2015  . Hyperlipidemia 07/23/2015  . H/O malignant neoplasm of prostate 05/08/2015  . AF (paroxysmal atrial fibrillation) ([place]) 04/08/2015   Social History   Tobacco Use  . Smoking status: Current Some Day Smoker    Packs/day: 0.25    Years: 40.00    Pack years: 10.00    Types: Cigarettes  . Smokeless tobacco: Never Used  Substance Use Topics  . Alcohol use: Yes    Alcohol/week: 2.0 standard drinks    Types: 2 Standard drinks or equivalent per week   Review of Systems   Constitutional: Negative for chills, fatigue and fever.  HENT: Negative for congestion, ear pain, sinus pain and sore throat.   Eyes: Negative.   Respiratory: Negative for cough, shortness of breath and wheezing.   Cardiovascular: Negative for chest pain, palpitations and leg swelling.    Gastrointestinal: Negative for abdominal pain, diarrhea, nausea and vomiting. Pain right groin, ?hernia Genitourinary: Negative for dysuria, frequency and urgency.  Musculoskeletal: Negative for arthralgias and myalgias.  Skin: Negative for color change, pallor and rash.  Neurological: Negative for syncope, light-headedness and headaches.  Psychiatric/Behavioral: The patient is not nervous/anxious.       Objective:   Physical Exam  Constitutional: He is oriented to person, place, and time. No distress.  HENT:  Head: Normocephalic and atraumatic.  Eyes: EOM are normal. No scleral icterus.  Neck: No tracheal deviation present.  Cardiovascular: Normal rate and regular rhythm.  Pulmonary/Chest: Effort normal and breath sounds normal. No respiratory distress.  Abdominal: Soft. Bowel sounds are normal. He exhibits no distension. There is no tenderness. There is no guarding.  Possible small right inguinal hernia, it is not obvious.   Neurological: He is alert and oriented to person, place, and time.  Walks with walker, does transfer positions slowly but this is improved from last visit.   Skin: Skin is warm. He is not diaphoretic. No pallor.  Psychiatric: He has a normal mood and affect. His behavior is normal.  Nursing note and vitals reviewed.  Vitals:   07/16/18 1006  BP: 108/60  Pulse: 62  Temp: 97.6 F (36.4 C)  SpO2: 95%      Assessment & Plan:   Right hip pain s/p fall -- this  has improved. Continues to work with PT  Decreased ambulation status/walker use -- PT sessions have been helpful in building his strength and improved his walking and position transfers.  Possible small right inguinal hernia -- Area in question is not obviously a hernia. Offered to order [place] to further assess but after discussing with patient and wife we have decided to watch the area for now and if it continues we will do imaging.   We are out of the high dose flu vaccine today, patient will return  to clinic in a week or 2 for nurse visit to get flu vaccine  Otherwise follow up in 2-3 months for recheck. Return to clinic sooner if needed.

## 2018-07-18 ENCOUNTER — Telehealth: Payer: Self-pay | Admitting: Family Medicine

## 2018-07-18 DIAGNOSIS — M25551 Pain in right hip: Secondary | ICD-10-CM

## 2018-07-18 NOTE — Telephone Encounter (Signed)
Copied from [place] [phone]. Topic: General - Other >> Jul 18, 2018  1:39 PM Cecelia Byars, NT wrote: Reason for CRM: Patients wife called and said he had a bad night last night and would like a refill  on the  tramadol , Sent to  [place], [place] [phone] (Phone) [phone] (Fax)

## 2018-07-18 NOTE — Telephone Encounter (Signed)
Sent to PCP to advise 

## 2018-07-18 NOTE — Telephone Encounter (Signed)
Rx request Patient saw Gary [name] on 07-17-18 but it does not look like she prescribed him this medication. This med is not on his med list but is on his historical med list and it was prescribed by another provider.

## 2018-07-18 NOTE — Telephone Encounter (Signed)
I will forward to Lauren to address as she saw him most recently. I have not seen him since January and would be unable to prescribe the tramadol with out seeing him in the office.

## 2018-07-19 MED ORDER — TRAMADOL HCL 50 MG PO TABS: 50.0000 mg | ORAL_TABLET | Freq: Three times a day (TID) | ORAL | 0 refills | Status: DC | PRN

## 2018-07-19 NOTE — Telephone Encounter (Signed)
Tramadol refill sent, patient uses as needed for moderate pain  PMP registry checked and is appropriate for refill

## 2018-07-26 ENCOUNTER — Ambulatory Visit: Payer: Medicare Other | Admitting: Family Medicine

## 2018-08-10 ENCOUNTER — Ambulatory Visit
Admission: RE | Admit: 2018-08-10 | Discharge: 2018-08-10 | Disposition: A | Discharge status: Home / Self Care | Payer: Medicare Other | Source: Ambulatory Visit | Attending: Family Medicine | Admitting: Family Medicine

## 2018-08-10 ENCOUNTER — Ambulatory Visit: Payer: Medicare Other | Admitting: Family Medicine

## 2018-08-10 ENCOUNTER — Encounter: Payer: Self-pay | Admitting: Family Medicine

## 2018-08-10 ENCOUNTER — Other Ambulatory Visit: Payer: Self-pay | Admitting: Family Medicine

## 2018-08-10 VITALS — BP 122/62 | HR 70 | Temp 98.1°F | Ht 73.0 in

## 2018-08-10 DIAGNOSIS — Z7409 Other reduced mobility: Secondary | ICD-10-CM | POA: Diagnosis not present

## 2018-08-10 DIAGNOSIS — M25552 Pain in left hip: Secondary | ICD-10-CM | POA: Diagnosis not present

## 2018-08-10 DIAGNOSIS — M24852 Other specific joint derangements of left hip, not elsewhere classified: Secondary | ICD-10-CM | POA: Diagnosis not present

## 2018-08-10 DIAGNOSIS — M24851 Other specific joint derangements of right hip, not elsewhere classified: Secondary | ICD-10-CM | POA: Insufficient documentation

## 2018-08-10 DIAGNOSIS — Z23 Encounter for immunization: Secondary | ICD-10-CM

## 2018-08-10 DIAGNOSIS — M25551 Pain in right hip: Secondary | ICD-10-CM | POA: Insufficient documentation

## 2018-08-10 MED ORDER — TRAMADOL HCL 50 MG PO TABS: 50.0000 mg | ORAL_TABLET | Freq: Three times a day (TID) | ORAL | 1 refills | Status: DC | PRN

## 2018-08-10 NOTE — Patient Instructions (Signed)
Try taking tramadol in AM and in PM with tylenol inbetween for pain control

## 2018-08-10 NOTE — Progress Notes (Signed)
Subjective:    Patient ID: Gary [name], male    DOB: [birth year], 84 y.o.   MRN: [id number]  HPI   Patient presents to clinic due to continued bilateral hip pain, right more than left.  Patient has had hip pain ever since fall back in the end of August at [facility].  Patient recently finished physical therapy program via home health care.  Wife and patient both state that they are not sure how much the physical therapy really helped.  Patient is having a harder time walking longer distances, uses a walker when having to walk shorter legs and if it is a longer distance he will use wheelchair.  Wife states he is also had another fall a little over a week ago, patient lost his balance and landed on bottom on ground.  Patient uses Tylenol during the day to help pain, and will take a tramadol for bedtime.  Patient does state the tramadol is helpful for the pain.  Imaging was done when he was in the hospital at the end of August including hip x-rays bilaterally and CT of pelvis.  Patient Active Problem List   Diagnosis Date Noted  . Uses walker 06/27/2018  . Decreased ambulation status 06/27/2018  . Right hip pain 06/27/2018  . History of recent fall 06/27/2018  . Altered mental status 06/20/2018  . Weakness of both legs 09/30/2016  . Tinea cruris 05/08/2016  . AV block, complete ([place]) 07/24/2015  . Tobacco abuse 07/24/2015  . HTN (hypertension) 07/24/2015  . Diminished pulses in lower extremity 07/24/2015  . Hyperlipidemia 07/23/2015  . H/O malignant neoplasm of prostate 05/08/2015  . AF (paroxysmal atrial fibrillation) ([place]) 04/08/2015   Social History   Tobacco Use  . Smoking status: Current Some Day Smoker    Packs/day: 0.25    Years: 40.00    Pack years: 10.00    Types: Cigarettes  . Smokeless tobacco: Never Used  Substance Use Topics  . Alcohol use: Yes    Alcohol/week: 2.0 standard drinks    Types: 2 Standard drinks or equivalent per week   Review of Systems     Constitutional: Negative for chills, fatigue and fever.  HENT: Negative for congestion, ear pain, sinus pain and sore throat.   Eyes: Negative.   Respiratory: Negative for cough, shortness of breath and wheezing.   Cardiovascular: Negative for chest pain, palpitations and leg swelling.  Gastrointestinal: Negative for abdominal pain, diarrhea, nausea and vomiting.  Genitourinary: Negative for dysuria, frequency and urgency.  Musculoskeletal:  Bilateral hip pain, decreased ambulation tolerance  Skin: Negative for color change, pallor and rash.  Neurological: Negative for syncope, light-headedness and headaches.  Psychiatric/Behavioral: The patient is not nervous/anxious.       Objective:   Physical Exam  Constitutional: He is oriented to person, place, and time. No distress.  Elderly male  HENT:  Head: Normocephalic and atraumatic.  Eyes: EOM are normal. No scleral icterus.  Neck: Normal range of motion. Neck supple. No tracheal deviation present.  Cardiovascular: Normal rate and regular rhythm.  Pulmonary/Chest: Effort normal and breath sounds normal. No respiratory distress.  Abdominal: Soft. Bowel sounds are normal. He exhibits no distension.  Musculoskeletal: He exhibits tenderness. He exhibits no edema.  Bilateral hip pain.  Straight leg raises, abduction and abduction of both legs also induce hip pain.  Patient is uncomfortable sitting in wheelchair, is shifting weight from side to side to try and get comfortable.  Neurological: He is alert and oriented  to person, place, and time.  Skin: Skin is warm and dry. No rash noted.  Psychiatric: He has a normal mood and affect. His behavior is normal.  Nursing note and vitals reviewed.      Vitals:   08/10/18 1335  BP: 122/62  Pulse: 70  Temp: 98.1 F (36.7 C)  SpO2: 94%   Assessment & Plan:   Bilateral hip pain/decreased ambulation status - patient advised to continue to use walker with short distances and wheelchair for  longer distances.  He has upcoming appointment with orthopedics on October 29.  We will get new x-rays of both hips due to recent fall a week and a half ago.  Patient and wife advised to try taking a tramadol in the morning and in the evening to see if this better helps control pain, also advised he can continue to use Tylenol throughout the day for pain as well.  Flu vaccine given  Keep regular follow-up here and with orthopedic as planned.  Return to clinic sooner if any issues arise.

## 2018-08-12 DIAGNOSIS — M25552 Pain in left hip: Principal | ICD-10-CM

## 2018-08-12 DIAGNOSIS — M25551 Pain in right hip: Secondary | ICD-10-CM | POA: Insufficient documentation

## 2018-09-18 ENCOUNTER — Ambulatory Visit: Payer: Medicare Other | Admitting: Family Medicine

## 2018-09-18 ENCOUNTER — Encounter: Payer: Self-pay | Admitting: Family Medicine

## 2018-09-18 VITALS — BP 92/68 | Temp 97.8°F | Ht 73.0 in | Wt 196.8 lb

## 2018-09-18 DIAGNOSIS — R8290 Unspecified abnormal findings in urine: Secondary | ICD-10-CM

## 2018-09-18 DIAGNOSIS — M25552 Pain in left hip: Secondary | ICD-10-CM

## 2018-09-18 DIAGNOSIS — R1900 Intra-abdominal and pelvic swelling, mass and lump, unspecified site: Secondary | ICD-10-CM

## 2018-09-18 DIAGNOSIS — R634 Abnormal weight loss: Secondary | ICD-10-CM | POA: Diagnosis not present

## 2018-09-18 DIAGNOSIS — R300 Dysuria: Secondary | ICD-10-CM | POA: Diagnosis not present

## 2018-09-18 DIAGNOSIS — R1030 Lower abdominal pain, unspecified: Secondary | ICD-10-CM | POA: Diagnosis not present

## 2018-09-18 DIAGNOSIS — M25551 Pain in right hip: Secondary | ICD-10-CM

## 2018-09-18 DIAGNOSIS — I480 Paroxysmal atrial fibrillation: Secondary | ICD-10-CM

## 2018-09-18 DIAGNOSIS — M5442 Lumbago with sciatica, left side: Secondary | ICD-10-CM | POA: Diagnosis not present

## 2018-09-18 DIAGNOSIS — M5441 Lumbago with sciatica, right side: Secondary | ICD-10-CM

## 2018-09-18 LAB — POCT URINALYSIS DIPSTICK
Glucose, UA: NEGATIVE
Ketones, UA: NEGATIVE
LEUKOCYTES UA: NEGATIVE
NITRITE UA: NEGATIVE
PH UA: 6 (ref 5.0–8.0)
PROTEIN UA: POSITIVE — AB
RBC UA: NEGATIVE
Spec Grav, UA: 1.025 (ref 1.010–1.025)
UROBILINOGEN UA: 1 U/dL

## 2018-09-18 NOTE — Patient Instructions (Addendum)
Nice to see you. We will get x-rays today and get you set up for CT scan. We are going to get lab work and a urine test today. If you develop worsening pain or you develop worsening abdominal pain, blood in your stool, confusion, headaches, numbness, weakness, loss of bowel or bladder function, or any new or changing symptoms please be evaluated immediately.

## 2018-09-18 NOTE — Progress Notes (Signed)
Tommi Rumps, MD Phone: [phone]  Gary [name] is a 84 y.o. male who presents today for f/u.  CC: A. fib, hip pain, weight loss, hyperlipidemia  A. fib: Patient is on Eliquis.  No palpitations or bleeding issues.  Hip pain: Patient continues to have issues with this status post fall.  He was hospitalized for this and underwent imaging which did not reveal any fractures.  He has been taking Tylenol and tramadol intermittently for the pain.  He notes discomfort in his bilateral hips as well as his low back.  He notes no numbness.  No saddle anesthesia or bowel or bladder incontinence.  He notes both legs feel weak.  Physical therapy was not helpful.  He noted no loss of consciousness.  He did hit his head when he fell previously.  No recurrent injury.  Weight loss: Notes weight loss since his fall.  It appears that there was a spurious weight entered of 155 pounds.  He does not have as much of an appetite.  He does note some lower abdominal discomfort that is described as throbbing and intermittent.  He does note some burning intermittently with urination.  No blood in his urine.  He does note constipation.  He does intermittently feel hot and cold though does not have any sweats or fevers.  He does note feeling depressed since the fall though no SI.  No anxiety.  No itching.  No blood in stool.  No diarrhea.  He vomited once.  Nauseous yesterday.  His wife does note some memory issues.  No headaches.  Hyperlipidemia: Taking Lipitor.  No chest pain or shortness of breath.  Social History   Tobacco Use  Smoking Status Current Some Day Smoker  . Packs/day: 0.25  . Years: 40.00  . Pack years: 10.00  . Types: Cigarettes  Smokeless Tobacco Never Used     ROS see history of present illness  Objective  Physical Exam Vitals:   09/18/18 1419  BP: 92/68  Temp: 97.8 F (36.6 C)    BP Readings from Last 3 Encounters:  09/18/18 92/68  08/10/18 122/62  07/16/18 108/60   Wt  Readings from Last 3 Encounters:  09/18/18 196 lb 12.8 oz (89.3 kg)  06/27/18 155 lb (70.3 kg)  06/20/18 204 lb (92.5 kg)    Physical Exam  Constitutional: No distress.  Cardiovascular: Normal rate, regular rhythm and normal heart sounds.  Pulmonary/Chest: Effort normal and breath sounds normal.  Abdominal: Soft. Bowel sounds are normal. He exhibits no distension. There is tenderness (Mild lower abdominal tenderness). There is no rebound and no guarding.  Question firmness palpated in the lower abdomen and pelvis  Musculoskeletal: He exhibits no edema.  Lymphadenopathy:    He has no cervical adenopathy.       Right: No inguinal adenopathy present.       Left: No inguinal adenopathy present.  Neurological: He is alert.  Skin: Skin is warm and dry. He is not diaphoretic.     Assessment/Plan: Please see individual problem list.  AF (paroxysmal atrial fibrillation) Stable.  Continue to follow with cardiology.  Bilateral hip pain Patient continues to have issues with this.  He also has issues with pain in his back.  His back has not been imaged yet.  We will obtain a lumbar spine film.  Weight loss Potentially could be related to depression versus difficulties related to his recent fall and pain.  He does have some lower abdominal discomfort and has a possible firmness  in that area.  We will obtain a CT abdomen pelvis with contrast.  We will consider treatment of depression once his lab and imaging evaluation is complete.  Lab work as outlined below.  Patient will follow-up in 2 weeks.  Dysuria Check urinalysis.   Orders Placed This Encounter  Procedures  . DG Lumbar Spine Complete    Standing Status:   Future    Number of Occurrences:   1    Standing Expiration Date:   11/19/2019    Order Specific Question:   Reason for Exam (SYMPTOM  OR DIAGNOSIS REQUIRED)    Answer:   low back pain radiating to bilateral legs since fall several months ago    Order Specific Question:    Preferred imaging location?    Answer:   [facility] Specific Question:   Radiology Contrast Protocol - do NOT remove file path    Answer:   \\[email or web address]\Radiant\DXFluoroContrastProtocols.pdf  . CT Abdomen Pelvis W Contrast    Patient can leave after scan per provider    Standing Status:   Future    Number of Occurrences:   1    Standing Expiration Date:   12/20/2019    Order Specific Question:   If indicated for the ordered procedure, I authorize the administration of contrast media per Radiology protocol    Answer:   Yes    Order Specific Question:   Preferred imaging location?    Answer:   [facility]    Order Specific Question:   Is Oral Contrast requested for this exam?    Answer:   Yes, Per Radiology protocol    Order Specific Question:   Call Results- Best Contact Number?    Answer:   [phone]    Order Specific Question:   Radiology Contrast Protocol - do NOT remove file path    Answer:   \\[email or web address]\epicdata\Radiant\CTProtocols.pdf  . PSA  . Comp Met (CMET)  . TSH  . Sedimentation rate  . CBC w/Diff  . Urine Microscopic Only  . POCT Urinalysis Dipstick    No orders of the defined types were placed in this encounter.    Tommi Rumps, MD [place]

## 2018-09-19 ENCOUNTER — Telehealth: Payer: Self-pay | Admitting: Family Medicine

## 2018-09-19 ENCOUNTER — Ambulatory Visit
Admission: RE | Admit: 2018-09-19 | Discharge: 2018-09-19 | Disposition: A | Discharge status: Home / Self Care | Payer: Medicare Other | Source: Ambulatory Visit | Attending: Family Medicine | Admitting: Family Medicine

## 2018-09-19 DIAGNOSIS — R1900 Intra-abdominal and pelvic swelling, mass and lump, unspecified site: Secondary | ICD-10-CM | POA: Diagnosis present

## 2018-09-19 DIAGNOSIS — M5442 Lumbago with sciatica, left side: Secondary | ICD-10-CM | POA: Diagnosis present

## 2018-09-19 DIAGNOSIS — M5441 Lumbago with sciatica, right side: Secondary | ICD-10-CM

## 2018-09-19 DIAGNOSIS — R1030 Lower abdominal pain, unspecified: Secondary | ICD-10-CM | POA: Insufficient documentation

## 2018-09-19 DIAGNOSIS — M25551 Pain in right hip: Secondary | ICD-10-CM

## 2018-09-19 DIAGNOSIS — S32000A Wedge compression fracture of unspecified lumbar vertebra, initial encounter for closed fracture: Secondary | ICD-10-CM

## 2018-09-19 DIAGNOSIS — M25552 Pain in left hip: Secondary | ICD-10-CM

## 2018-09-19 LAB — CBC WITH DIFFERENTIAL/PLATELET
BASOS PCT: 0.4 % (ref 0.0–3.0)
Basophils Absolute: 0 10*3/uL (ref 0.0–0.1)
EOS ABS: 0.3 10*3/uL (ref 0.0–0.7)
EOS PCT: 2.6 % (ref 0.0–5.0)
HEMATOCRIT: 43.6 % (ref 39.0–52.0)
HEMOGLOBIN: 14.7 g/dL (ref 13.0–17.0)
LYMPHS PCT: 26.8 % (ref 12.0–46.0)
Lymphs Abs: 2.8 10*3/uL (ref 0.7–4.0)
MCHC: 33.7 g/dL (ref 30.0–36.0)
MCV: 94 fl (ref 78.0–100.0)
Monocytes Absolute: 1.1 10*3/uL — ABNORMAL HIGH (ref 0.1–1.0)
Monocytes Relative: 10.4 % (ref 3.0–12.0)
Neutro Abs: 6.3 10*3/uL (ref 1.4–7.7)
Neutrophils Relative %: 59.8 % (ref 43.0–77.0)
Platelets: 350 10*3/uL (ref 150.0–400.0)
RBC: 4.64 Mil/uL (ref 4.22–5.81)
RDW: 13.7 % (ref 11.5–15.5)
WBC: 10.6 10*3/uL — AB (ref 4.0–10.5)

## 2018-09-19 LAB — COMPREHENSIVE METABOLIC PANEL
ALT: 22 U/L (ref 0–53)
AST: 18 U/L (ref 0–37)
Albumin: 4.1 g/dL (ref 3.5–5.2)
Alkaline Phosphatase: 121 U/L — ABNORMAL HIGH (ref 39–117)
BUN: 16 mg/dL (ref 6–23)
CHLORIDE: 99 meq/L (ref 96–112)
CO2: 23 meq/L (ref 19–32)
Calcium: 9.7 mg/dL (ref 8.4–10.5)
Creatinine, Ser: 0.68 mg/dL (ref 0.40–1.50)
GFR: 117.32 mL/min (ref >60.00)
GLUCOSE: 89 mg/dL (ref 70–99)
POTASSIUM: 4.6 meq/L (ref 3.5–5.1)
Sodium: 133 mEq/L — ABNORMAL LOW (ref 135–145)
Total Bilirubin: 0.5 mg/dL (ref 0.2–1.2)
Total Protein: 7.1 g/dL (ref 6.0–8.3)

## 2018-09-19 LAB — TSH: TSH: 2.1 u[IU]/mL (ref 0.35–4.50)

## 2018-09-19 LAB — URINALYSIS, MICROSCOPIC ONLY

## 2018-09-19 LAB — POCT I-STAT CREATININE: CREATININE: 0.7 mg/dL (ref 0.61–1.24)

## 2018-09-19 LAB — PSA: PSA: 0.01 ng/mL — AB (ref 0.10–4.00)

## 2018-09-19 LAB — SEDIMENTATION RATE: Sed Rate: 23 mm/hr — ABNORMAL HIGH (ref 0–20)

## 2018-09-19 MED ORDER — AMOXICILLIN-POT CLAVULANATE 875-125 MG PO TABS: 1.0000 | ORAL_TABLET | Freq: Two times a day (BID) | ORAL | 0 refills | Status: DC

## 2018-09-19 MED ORDER — IOPAMIDOL (ISOVUE-300) INJECTION 61%
100.0000 mL | Freq: Once | INTRAVENOUS | Status: AC | PRN
  Administered 2018-09-19: 100 mL via INTRAVENOUS

## 2018-09-19 MED ORDER — DOXYCYCLINE HYCLATE 100 MG PO TABS: 100.0000 mg | ORAL_TABLET | Freq: Two times a day (BID) | ORAL | 0 refills | Status: DC

## 2018-09-19 MED ORDER — TRAMADOL HCL 50 MG PO TABS: 50.0000 mg | ORAL_TABLET | Freq: Three times a day (TID) | ORAL | 0 refills | Status: DC | PRN

## 2018-09-19 NOTE — Telephone Encounter (Signed)
Called and spoke with wife. Wife advised and voiced understanding. She would like the medication sent to [facility] on [address].   Pt is willing to see orthopedist doctor.   Plus she wanted to know if you would be fille dthe tramadol for her husband or not?   Sent to PCP

## 2018-09-19 NOTE — Addendum Note (Signed)
Addended by: Leone Haven on: 09/19/2018 05:33 PM   Modules accepted: Orders

## 2018-09-19 NOTE — Telephone Encounter (Signed)
Please let the patient and his wife know that his CT scan revealed a possible pneumonia versus other mass in his right lung.  They recommended an antibiotic trial and then repeat CT scan in 3 to 4 weeks to evaluate for resolution. Once you speak with them I will send in an antibiotic.  Please see if he has had any fevers or cough.  He may have a small kidney stone on the left.  He appears to have mild retained stool which could represent constipation.  There were no apparent intra-abdominal masses.  He does appear to have 2 compression fractures that appears subacute.  I would suggest referral to an orthopedist for further evaluation of these as they may be contributing to the pain that he continues to have.  Thanks.

## 2018-09-19 NOTE — Telephone Encounter (Signed)
Antibiotics sent to pharmacy.  Orthopedic referral placed.  Tramadol refilled.  Controlled substance database reviewed.

## 2018-09-19 NOTE — Telephone Encounter (Signed)
1. Confluent 3 cm opacity in the posterior right lower lobe is nonspecific with main differential considerations of Bronchopneumonia and Neoplasm. Considering this could be occult on chest radiographs, follow-up Chest CT (noncontrast should suffice) is recommended in 3-4 weeks following trial of antibiotic therapy to ensure resolution and exclude underlying malignancy. 2. Compression fractures of L1 and L2 appear subacute. If specific therapy is desired, nuclear Medicine Whole-body Bone Scan (or Lumbar MRI without contrast if not contraindicated by pacemaker) would confirm candidacy for vertebroplasty. 3. No acute or inflammatory process identified in the abdomen or pelvis. Diverticulosis of the colon. Possible nephrolithiasis. 4.  Aortic Atherosclerosis (ICD10-I70.0).  Cardiomegaly.  These results will be called to the ordering clinician or representative by the Radiology Department at the imaging location.

## 2018-09-19 NOTE — Telephone Encounter (Signed)
Olivia Mackie, Radiology Technician at [facility] called and states that the pt's CT results are in the computer and ready for review; she also says that the pt was allowed to leave after completion of the scan; notified Cyril Mourning, CMA at [facility]; will also route to office for notification.

## 2018-09-20 DIAGNOSIS — R634 Abnormal weight loss: Secondary | ICD-10-CM | POA: Insufficient documentation

## 2018-09-20 DIAGNOSIS — R1030 Lower abdominal pain, unspecified: Secondary | ICD-10-CM | POA: Insufficient documentation

## 2018-09-20 NOTE — Assessment & Plan Note (Signed)
Patient continues to have issues with this.  He also has issues with pain in his back.  His back has not been imaged yet.  We will obtain a lumbar spine film.

## 2018-09-20 NOTE — Assessment & Plan Note (Signed)
Check urinalysis.

## 2018-09-20 NOTE — Assessment & Plan Note (Signed)
Stable.  Continue to follow with cardiology.

## 2018-09-20 NOTE — Assessment & Plan Note (Addendum)
Potentially could be related to depression versus difficulties related to his recent fall and pain.  He does have some lower abdominal discomfort and has a possible firmness in that area.  We will obtain a CT abdomen pelvis with contrast.  We will consider treatment of depression once his lab and imaging evaluation is complete.  Lab work as outlined below.  Patient will follow-up in 2 weeks.

## 2018-09-21 ENCOUNTER — Telehealth: Payer: Self-pay | Admitting: Family Medicine

## 2018-09-21 DIAGNOSIS — D72829 Elevated white blood cell count, unspecified: Secondary | ICD-10-CM

## 2018-09-21 DIAGNOSIS — R809 Proteinuria, unspecified: Secondary | ICD-10-CM

## 2018-09-21 DIAGNOSIS — R9389 Abnormal findings on diagnostic imaging of other specified body structures: Secondary | ICD-10-CM

## 2018-09-21 DIAGNOSIS — R748 Abnormal levels of other serum enzymes: Secondary | ICD-10-CM

## 2018-09-21 NOTE — Telephone Encounter (Signed)
Please let the patient know that it appears he has a small amount of protein in his urine.  We will need to recheck this in several weeks.  His white blood cell count is mildly elevated.  This can be a marker of infection.  We will recheck that in several weeks as well after he is completed the antibiotics.  His alkaline phosphatase is mildly elevated and we will recheck that as well.  This can be produced by bones or liver.  His other labs are acceptable.

## 2018-09-24 NOTE — Telephone Encounter (Signed)
Called and spoke with patient. Pt advised but did ask for me to call him back to speak to his wife in regards to everything because he stated that he will forget everything. I told the patient that I would call back later today to speak to his wife for him.

## 2018-09-24 NOTE — Telephone Encounter (Signed)
Called and spoke with patient. Pt advised and voiced understanding.  

## 2018-09-24 NOTE — Telephone Encounter (Signed)
Called and spoke with pt's wife Mrs. Baskette. Pt has been scheduled for a follow up appt for 10/05/2018 @ 3:15 the 4:30 was already taken. No other 4:30 appt available this month. Juliann Pulse please schedule this patient for the SAME DAY follow up appt on 10/05/2018 @ 3:15 PM. Thanks

## 2018-09-24 NOTE — Telephone Encounter (Signed)
Called and spoke to patient's wife. Wife advised and voiced understanding. She wanted to know does her husband need to be taking both the Augmentin and doxycycline to treat the urinary issue?   Please advise   Thanks

## 2018-09-24 NOTE — Telephone Encounter (Signed)
The antibiotics are for a possible pneumonia seen on his CT scan. He should be taking both of them. Please see if we can get him set up for a follow-up visit with me in 2 weeks for [place] to see how he is doing and to get the follow-up labs. He can be placed in a 4:30 time slot if needed. Thanks.

## 2018-09-25 NOTE — Telephone Encounter (Signed)
Scheduled

## 2018-10-04 ENCOUNTER — Telehealth: Payer: Self-pay

## 2018-10-04 NOTE — Telephone Encounter (Signed)
Sent to PCP for approval.  

## 2018-10-04 NOTE — Telephone Encounter (Signed)
Copied from [place] [id number]. Topic: General - Other >> Oct 04, 2018  4:10 PM Leward Quan A wrote: Reason for CRM:   Ebony Hail with [facility] called to request an order for MRI to be done for patient.  Patient can not be scheduled for issues with Compression fracture of Lumbar until he has an MRI. Please assist        Ph# [phone] Fax# [phone]

## 2018-10-05 ENCOUNTER — Encounter: Payer: Self-pay | Admitting: Family Medicine

## 2018-10-05 ENCOUNTER — Ambulatory Visit: Payer: Medicare Other | Admitting: Family Medicine

## 2018-10-05 VITALS — BP 120/60 | HR 96 | Temp 97.4°F | Ht 73.0 in | Wt 190.8 lb

## 2018-10-05 DIAGNOSIS — D72829 Elevated white blood cell count, unspecified: Secondary | ICD-10-CM | POA: Diagnosis not present

## 2018-10-05 DIAGNOSIS — R8290 Unspecified abnormal findings in urine: Secondary | ICD-10-CM | POA: Diagnosis not present

## 2018-10-05 DIAGNOSIS — R748 Abnormal levels of other serum enzymes: Secondary | ICD-10-CM

## 2018-10-05 DIAGNOSIS — R9389 Abnormal findings on diagnostic imaging of other specified body structures: Secondary | ICD-10-CM

## 2018-10-05 DIAGNOSIS — S32000A Wedge compression fracture of unspecified lumbar vertebra, initial encounter for closed fracture: Secondary | ICD-10-CM | POA: Insufficient documentation

## 2018-10-05 DIAGNOSIS — S32000D Wedge compression fracture of unspecified lumbar vertebra, subsequent encounter for fracture with routine healing: Secondary | ICD-10-CM

## 2018-10-05 DIAGNOSIS — R1030 Lower abdominal pain, unspecified: Secondary | ICD-10-CM

## 2018-10-05 DIAGNOSIS — R29898 Other symptoms and signs involving the musculoskeletal system: Secondary | ICD-10-CM

## 2018-10-05 DIAGNOSIS — R809 Proteinuria, unspecified: Secondary | ICD-10-CM

## 2018-10-05 LAB — CBC WITH DIFFERENTIAL/PLATELET
Basophils Absolute: 107 cells/uL (ref 0–200)
Basophils Relative: 1 %
Eosinophils Absolute: 460 cells/uL (ref 15–500)
Eosinophils Relative: 4.3 %
HEMATOCRIT: 43.7 % (ref 38.5–50.0)
HEMOGLOBIN: 14.7 g/dL (ref 13.2–17.1)
LYMPHS ABS: 2836 {cells}/uL (ref 850–3900)
MCH: 31.1 pg (ref 27.0–33.0)
MCHC: 33.6 g/dL (ref 32.0–36.0)
MCV: 92.6 fL (ref 80.0–100.0)
MONOS PCT: 8.9 %
MPV: 9.8 fL (ref 7.5–12.5)
NEUTROS ABS: 6345 {cells}/uL (ref 1500–7800)
Neutrophils Relative %: 59.3 %
Platelets: 360 10*3/uL (ref 140–400)
RBC: 4.72 10*6/uL (ref 4.20–5.80)
RDW: 12.8 % (ref 11.0–15.0)
Total Lymphocyte: 26.5 %
WBC mixed population: 952 cells/uL — ABNORMAL HIGH (ref 200–950)
WBC: 10.7 10*3/uL (ref 3.8–10.8)

## 2018-10-05 LAB — POCT URINALYSIS DIPSTICK
BILIRUBIN UA: NEGATIVE
GLUCOSE UA: NEGATIVE
KETONES UA: NEGATIVE
Nitrite, UA: NEGATIVE
PH UA: 6 (ref 5.0–8.0)
Protein, UA: NEGATIVE
Spec Grav, UA: 1.02 (ref 1.010–1.025)
UROBILINOGEN UA: 1 U/dL

## 2018-10-05 LAB — COMPREHENSIVE METABOLIC PANEL
AG RATIO: 1.2 (calc) (ref 1.0–2.5)
ALBUMIN MSPROF: 3.9 g/dL (ref 3.6–5.1)
ALT: 11 U/L (ref 9–46)
AST: 14 U/L (ref 10–35)
Alkaline phosphatase (APISO): 155 U/L — ABNORMAL HIGH (ref 40–115)
BILIRUBIN TOTAL: 0.6 mg/dL (ref 0.2–1.2)
BUN: 15 mg/dL (ref 7–25)
CO2: 26 mmol/L (ref 20–32)
Calcium: 9.7 mg/dL (ref 8.6–10.3)
Chloride: 100 mmol/L (ref 98–110)
Creat: 0.75 mg/dL (ref 0.70–1.11)
GLUCOSE: 87 mg/dL (ref 65–99)
Globulin: 3.2 g/dL (calc) (ref 1.9–3.7)
POTASSIUM: 5 mmol/L (ref 3.5–5.3)
Sodium: 137 mmol/L (ref 135–146)
TOTAL PROTEIN: 7.1 g/dL (ref 6.1–8.1)

## 2018-10-05 NOTE — Assessment & Plan Note (Signed)
Recheck today. 

## 2018-10-05 NOTE — Assessment & Plan Note (Signed)
Check urinalysis.

## 2018-10-05 NOTE — Assessment & Plan Note (Signed)
Possibly related to possible pneumonia seen on CT scan.  We will recheck CBC.

## 2018-10-05 NOTE — Assessment & Plan Note (Signed)
Noted on CT imaging.  Patient cannot have an MRI.  We will contact the specialist to try to get the patient into be evaluated for this.

## 2018-10-05 NOTE — Assessment & Plan Note (Signed)
Seems to have improved.  He will monitor.  CT imaging did not reveal a specific cause.

## 2018-10-05 NOTE — Progress Notes (Signed)
Tommi Rumps, MD Phone: [phone]  Gary [name] is a 84 y.o. adult who presents today for follow-up.  CC: Abnormal CT finding in lung, compression fracture, balance difficulty, proteinuria  Abnormal CT: Patient noted to have possible pneumonia versus other lung mass on CT abdomen and pelvis.  He notes no cough, fever, or wheezing.  He was treated with antibiotics and notes no significant difference from this.  He has a follow-up CT scan ordered to reevaluate.  Compression fracture: Noted on x-ray and CT scan.  He does note continued back discomfort.  The tramadol is helpful.  It does not make him drowsy.  No alcohol use with this.  No history of seizures.  He has a pacemaker in place.  We referred him to a spine specialist though they requested an MRI.  We will contact them to let them know the patient cannot have an MRI.  Patient reports he feels wobbly on his feet.  He did physical therapy for this and thinks it did not provide much benefit.  His abdominal pain seems to have improved.  He notes only having discomfort if he raises up or sits down.  No diarrhea.  Social History   Tobacco Use  Smoking Status Current Some Day Smoker  . Packs/day: 0.25  . Years: 40.00  . Pack years: 10.00  . Types: Cigarettes  Smokeless Tobacco Never Used     ROS see history of present illness  Objective  Physical Exam Vitals:   10/05/18 1525  BP: 120/60  Pulse: 96  Temp: (!) 97.4 F (36.3 C)  SpO2: 97%    BP Readings from Last 3 Encounters:  10/05/18 120/60  09/18/18 92/68  08/10/18 122/62   Wt Readings from Last 3 Encounters:  10/05/18 190 lb 12.8 oz (86.5 kg)  09/18/18 196 lb 12.8 oz (89.3 kg)  06/27/18 155 lb (70.3 kg)    Physical Exam Constitutional:      General: She is not in acute distress.    Appearance: She is not diaphoretic.  Cardiovascular:     Rate and Rhythm: Normal rate and regular rhythm.     Heart sounds: Normal heart sounds.  Pulmonary:     Effort:  Pulmonary effort is normal.     Breath sounds: Normal breath sounds.  Abdominal:     General: Bowel sounds are normal. There is no distension.     Palpations: Abdomen is soft.     Tenderness: There is no abdominal tenderness. There is no guarding or rebound.  Musculoskeletal:     Comments: No midline spine tenderness, no midline spine step-off  Skin:    General: Skin is warm and dry.  Neurological:     Mental Status: She is alert.     Comments: 5/5 strength bilateral quads, hamstrings, plantar flexion, and dorsiflexion, sensation light touch intact bilateral lower extremities      Assessment/Plan: Please see individual problem list.  Weakness of both legs Chronic issue.  Seems to be stable.  It would be beneficial to have him do physical therapy again though I would like his back evaluated for his compression fractures first.  Lower abdominal pain Seems to have improved.  He will monitor.  CT imaging did not reveal a specific cause.  Abnormal CT of the chest Patient with possible pneumonia versus mass.  He is completed treatment for pneumonia.  He has a CT scan scheduled per radiology recommendations later this month.  We will determine the next up and management after his  CT scan has been completed.  Compression fracture of lumbar vertebra (HCC) Noted on CT imaging.  Patient cannot have an MRI.  We will contact the specialist to try to get the patient into be evaluated for this.  Proteinuria Check urinalysis.  Leukocytosis Possibly related to possible pneumonia seen on CT scan.  We will recheck CBC.  Elevated alkaline phosphatase level Recheck today.   Orders Placed This Encounter  Procedures  . Urine Culture  . CBC w/Diff  . Comp Met (CMET)  . Protein / creatinine ratio, urine    Standing Status:   Future    Number of Occurrences:   1    Standing Expiration Date:   10/06/2019  . Urine Microscopic Only  . POCT Urinalysis Dipstick    No orders of the defined  types were placed in this encounter.    Tommi Rumps, MD [place]

## 2018-10-05 NOTE — Assessment & Plan Note (Signed)
Chronic issue.  Seems to be stable.  It would be beneficial to have him do physical therapy again though I would like his back evaluated for his compression fractures first.

## 2018-10-05 NOTE — Assessment & Plan Note (Signed)
Patient with possible pneumonia versus mass.  He is completed treatment for pneumonia.  He has a CT scan scheduled per radiology recommendations later this month.  We will determine the next up and management after his CT scan has been completed.

## 2018-10-05 NOTE — Patient Instructions (Signed)
Nice to see you. We will repeat lab work today and contact you with the results. Please keep your appointment for the CT scan. If you develop shortness of breath, fever, worsening pain, or any new or changing symptoms please seek medical attention immediately.

## 2018-10-05 NOTE — Telephone Encounter (Signed)
The patient has a pacemaker and cannot have an MRI.  Please inform them of this.  Please see if they would be willing to see the patient given this information.  Thanks.

## 2018-10-06 LAB — URINALYSIS, MICROSCOPIC ONLY
Hyaline Cast: NONE SEEN /LPF
SQUAMOUS EPITHELIAL / LPF: NONE SEEN /HPF (ref <=5)

## 2018-10-06 LAB — URINE CULTURE
MICRO NUMBER:: 91495521
Result:: NO GROWTH
SPECIMEN QUALITY:: ADEQUATE

## 2018-10-06 LAB — PROTEIN / CREATININE RATIO, URINE
Creatinine, Urine: 140 mg/dL (ref 20–320)
PROTEIN/CREAT RATIO: 114 mg/g{creat} (ref 22–128)
PROTEIN/CREATININE RATIO: 0.114 mg/mg{creat} (ref 0.022–0.12)
Total Protein, Urine: 16 mg/dL (ref 5–25)

## 2018-10-08 NOTE — Telephone Encounter (Signed)
Called and spoke with Ebony Hail with [facility] and she was advise of the inform per Dr. Caryl Bis. She is aware and voiced understanding that MRI can NOT be done.

## 2018-10-19 ENCOUNTER — Telehealth: Payer: Self-pay | Admitting: Family Medicine

## 2018-10-19 ENCOUNTER — Encounter: Payer: Self-pay | Admitting: Family Medicine

## 2018-10-19 ENCOUNTER — Ambulatory Visit
Admission: RE | Admit: 2018-10-19 | Discharge: 2018-10-19 | Disposition: A | Discharge status: Home / Self Care | Payer: Medicare Other | Source: Ambulatory Visit | Attending: Family Medicine | Admitting: Family Medicine

## 2018-10-19 DIAGNOSIS — R9389 Abnormal findings on diagnostic imaging of other specified body structures: Secondary | ICD-10-CM | POA: Diagnosis present

## 2018-10-19 DIAGNOSIS — R918 Other nonspecific abnormal finding of lung field: Secondary | ICD-10-CM

## 2018-10-19 DIAGNOSIS — J8410 Pulmonary fibrosis, unspecified: Secondary | ICD-10-CM | POA: Insufficient documentation

## 2018-10-19 NOTE — Telephone Encounter (Signed)
I spoke with the patient regarding his CT scan findings including the mass concerning for malignancy as well as lung nodules, CAD, and pulmonary fibrosis findings.  With the patient's permission I spoke with his wife as well and relayed these findings.  Discussed that he would need to see an oncologist as well as a cardiothoracic surgeon.  We will also get him to see pulmonology.  I spoke with our Lorriane Shire regarding the oncology and cardiothoracic surgery referrals.  She will contact these offices today to get the patient scheduled for next week for evaluation.

## 2018-10-22 ENCOUNTER — Other Ambulatory Visit (INDEPENDENT_AMBULATORY_CARE_PROVIDER_SITE_OTHER): Payer: Medicare Other

## 2018-10-22 ENCOUNTER — Telehealth: Payer: Self-pay | Admitting: *Deleted

## 2018-10-22 DIAGNOSIS — R748 Abnormal levels of other serum enzymes: Secondary | ICD-10-CM | POA: Diagnosis not present

## 2018-10-22 LAB — HEPATIC FUNCTION PANEL
ALBUMIN: 3.9 g/dL (ref 3.5–5.2)
ALT: 12 U/L (ref 0–53)
AST: 14 U/L (ref 0–37)
Alkaline Phosphatase: 149 U/L — ABNORMAL HIGH (ref 39–117)
Bilirubin, Direct: 0.1 mg/dL (ref 0.0–0.3)
TOTAL PROTEIN: 6.7 g/dL (ref 6.0–8.3)
Total Bilirubin: 0.7 mg/dL (ref 0.2–1.2)

## 2018-10-22 LAB — GAMMA GT: GGT: 12 U/L (ref 7–51)

## 2018-10-22 NOTE — Telephone Encounter (Signed)
The patient has been scheduled with Pulmonary and Oncology.

## 2018-10-22 NOTE — Telephone Encounter (Signed)
Copied from [place] [id number]. Topic: General - Other >> Oct 22, 2018  4:22 PM Vernona Rieger wrote: Reason for CRM: Patient's wife would like to speak with Dr Caryl Bis about all the lung specialist that he has been referred too

## 2018-10-23 NOTE — Telephone Encounter (Signed)
He will need to see all 3. Dr Rogue Bussing is an oncologist and the patient will need to see him in case the lesion that has been seen is a cancer.

## 2018-10-23 NOTE — Telephone Encounter (Signed)
Called and spoke with patient. Pt advised and voiced understanding. Pt stated that she also got a phone call from Dr.Brahmanbay  A blood specialist doctor do they need to see him too?

## 2018-10-23 NOTE — Telephone Encounter (Signed)
Questions were why they are receiving so many phone calls from different Lung doctors do they need to be seeing all of these lung doctors?   She stated that she has received phone calls from the following doctors all in the category of LUNG speciality.   Dr. Shawna Orleans in the [facility] specialty is internal medicine and lung  Dr.Oaks is located across the stress from the eye [facility] specialty is Lung doctor  Call Smithton who is in located in [place] on [address]. who is a Lung and heart doctor   Pt's wife wanted to know why they need to see so may lung doctos.

## 2018-10-23 NOTE — Telephone Encounter (Signed)
I apologize for any confusion.  There was one referral put into thoracic surgery though it was sent to 2 different offices.  There was a referral to pulmonology as well.  The patient needs to see Dr. Alva Garnet and Dr. Genevive Bi.  They do not need to see Dr. Princess Perna.

## 2018-10-25 DIAGNOSIS — Y92009 Unspecified place in unspecified non-institutional (private) residence as the place of occurrence of the external cause: Secondary | ICD-10-CM | POA: Insufficient documentation

## 2018-10-25 NOTE — Telephone Encounter (Signed)
Called and spoke with pt's wife. Wife advised and voiced understanding.

## 2018-10-25 NOTE — Telephone Encounter (Signed)
Called pt and left a VM for Mrs. Sebald to call me back. CRM created and sent to [facility] pool.

## 2018-10-25 NOTE — Telephone Encounter (Signed)
Gary [name] returning call to [facility]. FC unavailable.

## 2018-10-26 ENCOUNTER — Other Ambulatory Visit: Payer: Self-pay

## 2018-10-26 ENCOUNTER — Encounter: Payer: Self-pay | Admitting: Pulmonary Disease

## 2018-10-26 ENCOUNTER — Telehealth: Payer: Self-pay | Admitting: Family Medicine

## 2018-10-26 ENCOUNTER — Encounter: Payer: Self-pay | Admitting: Internal Medicine

## 2018-10-26 ENCOUNTER — Inpatient Hospital Stay: Payer: Medicare Other | Attending: Internal Medicine | Admitting: Internal Medicine

## 2018-10-26 ENCOUNTER — Encounter: Payer: Self-pay | Admitting: *Deleted

## 2018-10-26 ENCOUNTER — Ambulatory Visit (INDEPENDENT_AMBULATORY_CARE_PROVIDER_SITE_OTHER): Payer: Medicare Other | Admitting: Pulmonary Disease

## 2018-10-26 VITALS — BP 112/74 | HR 61 | Temp 97.6°F | Resp 20 | Ht 73.0 in | Wt 195.0 lb

## 2018-10-26 DIAGNOSIS — R54 Age-related physical debility: Secondary | ICD-10-CM

## 2018-10-26 DIAGNOSIS — Z7901 Long term (current) use of anticoagulants: Secondary | ICD-10-CM | POA: Diagnosis not present

## 2018-10-26 DIAGNOSIS — I10 Essential (primary) hypertension: Secondary | ICD-10-CM | POA: Diagnosis not present

## 2018-10-26 DIAGNOSIS — R918 Other nonspecific abnormal finding of lung field: Secondary | ICD-10-CM

## 2018-10-26 DIAGNOSIS — I4891 Unspecified atrial fibrillation: Secondary | ICD-10-CM | POA: Diagnosis not present

## 2018-10-26 DIAGNOSIS — J849 Interstitial pulmonary disease, unspecified: Secondary | ICD-10-CM | POA: Diagnosis not present

## 2018-10-26 DIAGNOSIS — F17210 Nicotine dependence, cigarettes, uncomplicated: Secondary | ICD-10-CM | POA: Diagnosis not present

## 2018-10-26 DIAGNOSIS — Z950 Presence of cardiac pacemaker: Secondary | ICD-10-CM | POA: Diagnosis not present

## 2018-10-26 DIAGNOSIS — R590 Localized enlarged lymph nodes: Secondary | ICD-10-CM | POA: Insufficient documentation

## 2018-10-26 DIAGNOSIS — J8410 Pulmonary fibrosis, unspecified: Secondary | ICD-10-CM | POA: Diagnosis not present

## 2018-10-26 DIAGNOSIS — F17200 Nicotine dependence, unspecified, uncomplicated: Secondary | ICD-10-CM

## 2018-10-26 DIAGNOSIS — R911 Solitary pulmonary nodule: Secondary | ICD-10-CM | POA: Diagnosis not present

## 2018-10-26 DIAGNOSIS — Z79899 Other long term (current) drug therapy: Secondary | ICD-10-CM | POA: Insufficient documentation

## 2018-10-26 NOTE — Progress Notes (Addendum)
PULMONARY CONSULT NOTE  Requesting MD/Service: Caryl Bis Date of initial consultation: 10/26/18 Reason for consultation: pulmonary fibrosis. Lung mass  PT PROFILE: 85 y.o. adult smoker (presently approx 5-8 cigs/day. Previously pack per day or more) initially underwent CTAP 11/27 to investigate lower abdominal pain and found to have a RLL 3 cm opacity (and no significant findings in the abdomen). Follow up CT chest performed and patient referred for further evaluation  DATA: 09/19/18 CTAP: Confluent 3 cm opacity in the posterior right lower lobe is nonspecific with main differential considerations of bronchopneumonia and Neoplasm. Compression fractures of L1 and L2 appear subacute. No acute or inflammatory process identified in the abdomen or pelvis 10/19/18 CT chest: 2.4 cm irregular RLL mass worrisome for primary malignancy. Three small indeterminate LUL nodules. Mildly enlarged mediastinal and R hilar lymph nodes. Interstitial lung disease/fibrosis  INTERVAL:  HPI:  As above. Seen by Dr Rogue Bussing earlier on this day.  Patient is rather frail and of advanced age.  He has smoked all of his adult life and presently smokes up to 8 cigarettes/day.  Previously he was a pack-a-day smoker.  He has no significant occupational or environmental exposures.  He recently underwent CT of the abdomen for lower abdominal pain.  There was an incidental finding of right lower lobe opacity.  This was followed up with CT scan of the chest.  Findings are documented above.  He is extremely frail and sedentary.  He does not describe limiting shortness of breath or other respiratory symptoms.  He denies significant cough, purulent sputum, hemoptysis, chest pain, lower extremity edema, calf tenderness.  His family does note a 25 pound weight loss in the past 6 months without explanation.  Past Medical History:  Diagnosis Date  . Atrial fibrillation ([place])   . AV block    s/p Pacemaker placement.  . Chicken pox    . History of blood transfusion   . Hyperlipidemia   . Hypertension     Past Surgical History:  Procedure Laterality Date  . HERNIA REPAIR    . KNEE ARTHROSCOPY    . PACEMAKER PLACEMENT    . PROSTATECTOMY      MEDICATIONS: I have reviewed all medications and confirmed regimen as documented  Social History   Socioeconomic History  . Marital status: Married    Spouse name: Not on file  . Number of children: Not on file  . Years of education: Not on file  . Highest education level: Not on file  Occupational History  . Not on file  Social Needs  . Financial resource strain: Not on file  . Food insecurity:    Worry: Not on file    Inability: Not on file  . Transportation needs:    Medical: Not on file    Non-medical: Not on file  Tobacco Use  . Smoking status: Current Some Day Smoker    Packs/day: 0.25    Years: 40.00    Pack years: 10.00    Types: Cigarettes  . Smokeless tobacco: Never Used  Substance and Sexual Activity  . Alcohol use: Yes    Alcohol/week: 2.0 standard drinks    Types: 2 Standard drinks or equivalent per week  . Drug use: No  . Sexual activity: Not Currently  Lifestyle  . Physical activity:    Days per week: Not on file    Minutes per session: Not on file  . Stress: Not on file  Relationships  . Social connections:    Talks on phone: Not  on file    Gets together: Not on file    Attends religious service: Not on file    Active member of club or organization: Not on file    Attends meetings of clubs or organizations: Not on file    Relationship status: Not on file  . Intimate partner violence:    Fear of current or ex partner: Not on file    Emotionally abused: Not on file    Physically abused: Not on file    Forced sexual activity: Not on file  Other Topics Concern  . Not on file  Social History Narrative   qiut smoking dc 2019; smoking 1/2 ppd x 15-20 years; 3drinks/liquior a week; gies with walker at home;     Family History   Problem Relation Age of Onset  . Cancer Father   . Heart disease Father   . Cancer Brother     ROS: No fever, myalgias/arthralgias No new focal weakness or sensory deficits No otalgia, hearing loss, visual changes, nasal and sinus symptoms, mouth and throat problems No neck pain or adenopathy No abdominal pain, N/V/D, diarrhea, change in bowel pattern No dysuria, change in urinary pattern   Vitals:   10/26/18 1018 10/26/18 1024  BP:  110/72  Pulse:  62  Resp: 16   SpO2:  94%  Weight: 195 lb (88.5 kg)   Height: 6\' 1"  (1.854 m)   Room air   EXAM:  Gen: Elderly, frail, in wheelchair, No overt respiratory distress HEENT: NCAT, sclera white, oropharynx normal Neck: Supple without LAN, thyromegaly, JVD Lungs: breath sounds mildly diminished, percussion normal, no wheezes, mild bibasilar crackles adventitious sounds Cardiovascular: RRR, no murmurs noted Abdomen: Soft, nontender, normal BS Ext: without clubbing, cyanosis, edema Neuro: CNs grossly intact, motor and sensory intact Skin: Limited exam, no lesions noted  DATA:   BMP Latest Ref Rng & Units 10/05/2018 09/19/2018 09/18/2018  Glucose 65 - 99 mg/dL 87 - 89  BUN 7 - 25 mg/dL 15 - 16  Creatinine 0.70 - 1.11 mg/dL 0.75 0.70 0.68  BUN/Creat Ratio 6 - 22 (calc) NOT APPLICABLE - -  Sodium 195 - 146 mmol/L 137 - 133(L)  Potassium 3.5 - 5.3 mmol/L 5.0 - 4.6  Chloride 98 - 110 mmol/L 100 - 99  CO2 20 - 32 mmol/L 26 - 23  Calcium 8.6 - 10.3 mg/dL 9.7 - 9.7    CBC Latest Ref Rng & Units 10/05/2018 09/18/2018 06/20/2018  WBC 3.8 - 10.8 Thousand/uL 10.7 10.6(H) 12.1(H)  Hemoglobin 13.2 - 17.1 g/dL 14.7 14.7 15.2  Hematocrit 38.5 - 50.0 % 43.7 43.6 44.2  Platelets 140 - 400 Thousand/uL 360 350.0 250    CXR: No recent film  I have personally reviewed all chest radiographs reported above including CXRs and CT chest unless otherwise indicated  IMPRESSION:   1) smoker 2) mild pulmonary fibrosis, not otherwise  specified 3) moderate to severe frailty 4) chronic anticoagulation for paroxysmal atrial fibrillation 5) incidental finding of RLL lung mass  The radiographic appearance of the RLL opacity is suggestive of possible malignancy and he is certainly at high risk given advanced age, smoking status and underlying pulmonary fibrosis.  This malignancy is presently asymptomatic and was an incidental finding on a CT scan of the abdomen.  We discussed diagnostic and management options.  One option offered was to "wait and watch" with a repeat CT scan of the chest in 3 months.  Alternatively, we could pursue a biopsy now.  A third  option discussed would be a PET scan.   If this is indeed proven to be malignancy, he would not be a candidate for aggressive intervention given his advanced age and frailty.  If a biopsy is required, performing this bronchoscopically would be difficult and would require navigation bronchoscopy with general anesthesia and intubation.  Therefore, the most desirable approach would be a transthoracic approach if deemed feasible by interventional radiology.  Any biopsy would require holding of anticoagulation before it is performed.   PLAN:  A PET scan has been ordered.  If the RLL opacity is not FDG avid, no further evaluation will be recommended.  If it has low-level activity, we will discuss further whether to pursue a biopsy now versus wait and watch over time.  If it is high level activity, we will pursue a biopsy now.  I have discussed this plan with Dr. Rogue Bussing.  After the procedure is performed we will contact the patient.  No follow-up has been arranged at this time.  He will be discussed at the next oncology conference.   Merton Border, MD PCCM service Mobile [phone] Pager [phone] 10/28/2018 1:54 PM

## 2018-10-26 NOTE — Progress Notes (Signed)
Met with patient, spouse, and son at initial medical oncology consultation. Introduced Acupuncturist and reviewed plan of care, to include pulmonary consult today, pet scan, consult with thoracic surgery, and follow up with Dr. Rogue Bussing if diagnosis of lung cancer is made. Patient and family are in agreement with plan of care and know to contact myself or [facility] with any questions.

## 2018-10-26 NOTE — Progress Notes (Signed)
[facility] CONSULT NOTE  Patient Care Team: Leone Haven, MD as PCP - General (Family Medicine)  CHIEF COMPLAINTS/PURPOSE OF CONSULTATION:  Right lung mass  #Right LL-mass 2.4 cm in size; question hilar/mediastinal adenopathy on PET scan  #Interstitial lung disease/active smoker  #A. fib Eliquis; permanent pacemaker; compression fractures [Dr.Gaines; Ortho;[place]]  No history exists.    HISTORY OF PRESENTING ILLNESS: Patient is a poor historian. Gary [name] 85 y.o.  adult longstanding history of smoking has been referred to [place] for further evaluation regarding newly found right lower lobe mass approximately 2.4 cm in size.  As per the family patient had a recent fall with resultant worsening back pain.  Patient had further work-up with imaging that showed compression fractures; also picked up incidental right lower lobe mass.  Patient was initially treated with antibiotics-however since the mass did not resolve he has been referred to [place] for further evaluation.  Patient continues to complain of back pain and chronic joint pains.  Denies any headaches.  In the house he goes around with a walker.  Review of Systems  Constitutional: Positive for malaise/fatigue and weight loss. Negative for chills, diaphoresis and fever.  HENT: Negative for nosebleeds and sore throat.   Eyes: Negative for double vision.  Respiratory: Positive for cough and shortness of breath. Negative for hemoptysis, sputum production and wheezing.   Cardiovascular: Negative for chest pain, palpitations, orthopnea and leg swelling.  Gastrointestinal: Negative for abdominal pain, blood in stool, constipation, diarrhea, heartburn, melena, nausea and vomiting.  Genitourinary: Negative for dysuria, frequency and urgency.  Musculoskeletal: Positive for back pain and joint pain.  Skin: Negative.  Negative for itching and rash.  Neurological: Negative for dizziness, tingling, focal weakness, weakness and  headaches.  Endo/Heme/Allergies: Does not bruise/bleed easily.  Psychiatric/Behavioral: Negative for depression. The patient is not nervous/anxious and does not have insomnia.      MEDICAL HISTORY:  Past Medical History:  Diagnosis Date  . Atrial fibrillation ([place])   . AV block    s/p Pacemaker placement.  . Chicken pox   . History of blood transfusion   . Hyperlipidemia   . Hypertension     SURGICAL HISTORY: Past Surgical History:  Procedure Laterality Date  . HERNIA REPAIR    . KNEE ARTHROSCOPY    . PACEMAKER PLACEMENT    . PROSTATECTOMY      SOCIAL HISTORY: Social History   Socioeconomic History  . Marital status: Married    Spouse name: Not on file  . Number of children: Not on file  . Years of education: Not on file  . Highest education level: Not on file  Occupational History  . Not on file  Social Needs  . Financial resource strain: Not on file  . Food insecurity:    Worry: Not on file    Inability: Not on file  . Transportation needs:    Medical: Not on file    Non-medical: Not on file  Tobacco Use  . Smoking status: Current Some Day Smoker    Packs/day: 0.25    Years: 40.00    Pack years: 10.00    Types: Cigarettes  . Smokeless tobacco: Never Used  Substance and Sexual Activity  . Alcohol use: Yes    Alcohol/week: 2.0 standard drinks    Types: 2 Standard drinks or equivalent per week  . Drug use: No  . Sexual activity: Not Currently  Lifestyle  . Physical activity:    Days per week: Not  on file    Minutes per session: Not on file  . Stress: Not on file  Relationships  . Social connections:    Talks on phone: Not on file    Gets together: Not on file    Attends religious service: Not on file    Active member of club or organization: Not on file    Attends meetings of clubs or organizations: Not on file    Relationship status: Not on file  . Intimate partner violence:    Fear of current or ex partner: Not on file    Emotionally abused:  Not on file    Physically abused: Not on file    Forced sexual activity: Not on file  Other Topics Concern  . Not on file  Social History Narrative   qiut smoking dc 2019; smoking 1/2 ppd x 15-20 years; 3drinks/liquior a week; gies with walker at home;     FAMILY HISTORY: Family History  Problem Relation Age of Onset  . Cancer Father   . Heart disease Father   . Cancer Brother     ALLERGIES:  has No Known Allergies.  MEDICATIONS:  Current Outpatient Medications  Medication Sig Dispense Refill  . apixaban (ELIQUIS) 2.5 MG TABS tablet Take 1 tablet by mouth 2 (two) times daily.     . Multiple Vitamins-Minerals (PRESERVISION AREDS 2+MULTI VIT PO) Take 1 tablet by mouth 2 (two) times daily.    . traMADol (ULTRAM) 50 MG tablet Take 1 tablet (50 mg total) by mouth every 8 (eight) hours as needed for moderate pain. 60 tablet 0   No current facility-administered medications for this visit.       Marland Kitchen  PHYSICAL EXAMINATION: ECOG PERFORMANCE STATUS: 1 - Symptomatic but completely ambulatory  Vitals:   10/26/18 0915  BP: 112/74  Pulse: 61  Resp: 20  Temp: 97.6 F (36.4 C)  SpO2: 97%   Filed Weights   10/26/18 0921  Weight: 195 lb (88.5 kg)    Physical Exam  Constitutional: She is oriented to person, place, and time.  Frail-appearing Caucasian male patient.  He is in a wheelchair.  He is accompanied by his wife and son.  HENT:  Head: Normocephalic and atraumatic.  Mouth/Throat: Oropharynx is clear and moist. No oropharyngeal exudate.  Eyes: Pupils are equal, round, and reactive to light.  Neck: Normal range of motion. Neck supple.  Cardiovascular: Normal rate and regular rhythm.  Decreased air entry bilaterally.  Pulmonary/Chest: No respiratory distress. She has no wheezes.  Abdominal: Soft. Bowel sounds are normal. She exhibits no distension and no mass. There is no abdominal tenderness. There is no rebound and no guarding.  Musculoskeletal: Normal range of motion.         General: No tenderness or edema.  Neurological: She is alert and oriented to person, place, and time.  Skin: Skin is warm.  Psychiatric: Affect normal.     LABORATORY DATA:  I have reviewed the data as listed Lab Results  Component Value Date   WBC 10.7 10/05/2018   HGB 14.7 10/05/2018   HCT 43.7 10/05/2018   MCV 92.6 10/05/2018   PLT 360 10/05/2018   Recent Labs    01/11/18 2000 06/20/18 1711 09/18/18 1505 09/19/18 1005 10/05/18 1549 10/22/18 0946  NA 132* 134* 133*  --  137  --   K 4.2 4.7 4.6  --  5.0  --   CL 97* 103 99  --  100  --   CO2 25  23 23  --  26  --   GLUCOSE 100* 110* 89  --  87  --   BUN 17 15 16   --  15  --   CREATININE 0.80 0.65 0.68 0.70 0.75  --   CALCIUM 9.2 9.1 9.7  --  9.7  --   GFRNONAA >60 >60  --   --   --   --   GFRAA >60 >60  --   --   --   --   PROT 7.4 7.2 7.1  --  7.1 6.7  ALBUMIN 4.0 4.1 4.1  --   --  3.9  AST 22 23 18   --  14 14  ALT 17 16 22   --  11 12  ALKPHOS 79 75 121*  --   --  149*  BILITOT 0.8 1.0 0.5  --  0.6 0.7  BILIDIR  --   --   --   --   --  0.1    RADIOGRAPHIC STUDIES: I have personally reviewed the radiological images as listed and agreed with the findings in the report. Ct Chest Wo Contrast  Result Date: 10/19/2018 CLINICAL DATA:  85 year old male with RIGHT LOWER lobe opacity identified on recent abdomen CT. History of chronic interstitial lung disease/fibrosis. EXAM: CT CHEST WITHOUT CONTRAST TECHNIQUE: Multidetector CT imaging of the chest was performed following the standard protocol without IV contrast. COMPARISON:  09/19/2018 abdominal CT.  Prior radiographs FINDINGS: Cardiovascular: Cardiomegaly and heavy coronary artery atherosclerotic calcifications are identified. A LEFT-sided pacemaker is again noted. Aortic atherosclerotic calcifications identified without aneurysm. No pericardial effusion. Mediastinum/Nodes: Mildly enlarged lymph nodes are as follows: A 1.5 cm precarinal node (series 2: Image 66) A 1  cm RIGHT hilar node (2:78) A 1.1 cm subcarinal node (2:73). No mediastinal masses are identified. The visualized thyroid and esophagus are unremarkable. Lungs/Pleura: A 2.2 x 4 x 2.4 cm irregular mass within the posterior RIGHT LOWER lobe is worrisome for malignancy. Small (3 mm or less) nodules are identified, primarily within the LEFT UPPER lobe (series 3, images 46, 49, 60, 68, and 71). Mild centrilobular emphysema is noted. Peripheral interlobular septal thickening with some honeycombing identified. No pleural effusion or pneumothorax. Upper Abdomen: No acute abnormality. No enlarged UPPER abdominal lymph nodes noted. Musculoskeletal: A 50% 6 L1 compression fracture is again identified. No suspicious focal bony abnormalities are identified. IMPRESSION: 1. 2.4 cm RIGHT LOWER lobe mass worrisome for primary malignancy. Indeterminate mildly enlarged mediastinal and RIGHT hilar lymph nodes. 2. Small pulmonary nodules, primarily within the LEFT UPPER lobe, indeterminate. Consider attention to these nodules on future CTs. 3. Interstitial lung disease/fibrosis. 4. Cardiomegaly and coronary artery disease. 5. 50% L1 compression fracture again noted. 6. Aortic Atherosclerosis (ICD10-I70.0) and Emphysema (ICD10-J43.9). Electronically Signed   By: Margarette Canada M.D.   On: 10/19/2018 09:31    ASSESSMENT & PLAN:   Right lower lobe lung mass # Right lower lobe lung mass 2.4 cm in size/indeterminate mediastinal/hilar adenopathy-highly concerning for malignancy.  Patient will need tissue diagnosis for further evaluation. Discussed the possible need for a PET scan await pulmonary evaluation.  Awaiting pulmonary evaluation later in the day today.  Discussed with the patient  #Discussed with the patient and family regarding treatment options if lesion on biopsy is positive for malignancy.  Patient is not a surgical candidate; definitive chemotherapy radiation therapy versus SBRT versus immunotherapy by itself.   # Lung  fibrosis/ILD-chronic stable.  #History of A. fib on Eliquis; status post permanent  pacemaker.  Disposition:  # Follow-up to be decided based upon pulmonary evaluation/PET scan  # Thank you for allowing me to participate in the care of your pleasant patient. Please do not hesitate to contact me with questions or concerns in the interim.  Addendum: Discussed with Dr. Jamal Collin who feels patient is a poor candidate/borderline candidate for even for a biopsy given his tenuous respiratory status/comorbidities.  Await PET scan to help decide the next step.  All questions were answered. The patient knows to call the clinic with any problems, questions or concerns.    Cammie Sickle, MD 10/30/2018 2:54 PM

## 2018-10-26 NOTE — Patient Instructions (Signed)
PET scan has been ordered.  Any further steps in the evaluation of the "opacity" in your right lung will be determined by the results of the PET scan.  Follow-up will be arranged depending on the results of the PET scan.

## 2018-10-26 NOTE — Telephone Encounter (Signed)
Copied from [place] [phone]. Topic: Quick Communication - See Telephone Encounter >> Oct 26, 2018  1:32 PM Burchel, Abbi R wrote: CRM for notification. See Telephone encounter for: 10/26/18.  Pt's wife called to ask if they still need to see Dr Genevive Bi following PET scan.  They saw Dr Alva Garnet today and he suggested that appt with Dr Genevive Bi may not be necessary. Please advise, [phone].

## 2018-10-26 NOTE — Assessment & Plan Note (Addendum)
#   Right lower lobe lung mass 2.4 cm in size/indeterminate mediastinal/hilar adenopathy-highly concerning for malignancy.  Patient will need tissue diagnosis for further evaluation. Discussed the possible need for a PET scan await pulmonary evaluation.  Awaiting pulmonary evaluation later in the day today.  Discussed with the patient  #Discussed with the patient and family regarding treatment options if lesion on biopsy is positive for malignancy.  Patient is not a surgical candidate; definitive chemotherapy radiation therapy versus SBRT versus immunotherapy by itself.   # Lung fibrosis/ILD-chronic stable.  #History of A. fib on Eliquis; status post permanent pacemaker.  Disposition:  # Follow-up to be decided based upon pulmonary evaluation/PET scan  # Thank you for allowing me to participate in the care of your pleasant patient. Please do not hesitate to contact me with questions or concerns in the interim.  # I reviewed the blood work- with the patient in detail; also reviewed the imaging independently [as summarized above]; and with the patient in detail.    Addendum: Discussed with Dr. Jamal Collin who feels patient is a poor candidate/borderline candidate for even for a biopsy given his tenuous respiratory status/comorbidities.  Await PET scan to help decide the next step.

## 2018-10-26 NOTE — Progress Notes (Signed)
Patient reports a fall approximately 1 month ago. Patient was evaluated in ER post fall and had negative xrays. He continued to c/o of Lower back pain x 1 month. Patient's wife reports that patient has L1 and L2 compression fracture seen on lumbar ct scan. Pt was subsequently worked up by pcp and found to have a lung mass. Patient is scheduled for 10/30/17  orthopedic - [facility]-Thomas Gaines to evaluate compression fractures.

## 2018-10-29 NOTE — Telephone Encounter (Signed)
No. They are available to me in the EMR.   Thanks  Dr Alva Garnet

## 2018-10-29 NOTE — Telephone Encounter (Signed)
Called and spoke with Gary [name]. Gary [name] advised and voiced understanding. She stated that Dr. Shawna Orleans does not have the lab results regarding the alkaline phosphate level is it OK if I fax this to them?

## 2018-10-29 NOTE — Telephone Encounter (Signed)
The appointment with Dr. Genevive Bi may not be necessary depending on the results of the PET scan.  Once this returns I believe they are going to determine if he needs to see Dr. Genevive Bi.  1 of the nurses from the cancer center did contact me and let me know that he was going to have the PET scan and then they were going to determine whether or not he needs to see Dr. Genevive Bi.

## 2018-10-29 NOTE — Telephone Encounter (Signed)
Relation to pt: spouse  Call back number: [phone]   Reason for call:  Spouse checking on the status of message below.

## 2018-10-29 NOTE — Telephone Encounter (Signed)
Sent to PCP to advise. Thanks

## 2018-10-29 NOTE — Telephone Encounter (Signed)
It is okay to fax the labs from 10/22/2018 to Dr. Alva Garnet at [facility] pulmonology in [place].

## 2018-10-29 NOTE — Telephone Encounter (Signed)
Dr. Alva Garnet do you need these results fax to you?

## 2018-10-31 ENCOUNTER — Other Ambulatory Visit: Payer: Self-pay | Admitting: Orthopedic Surgery

## 2018-10-31 DIAGNOSIS — S32010A Wedge compression fracture of first lumbar vertebra, initial encounter for closed fracture: Secondary | ICD-10-CM

## 2018-10-31 DIAGNOSIS — S32020A Wedge compression fracture of second lumbar vertebra, initial encounter for closed fracture: Secondary | ICD-10-CM

## 2018-11-01 ENCOUNTER — Encounter: Payer: Medicare Other | Admitting: Thoracic Surgery (Cardiothoracic Vascular Surgery)

## 2018-11-01 ENCOUNTER — Ambulatory Visit
Admission: RE | Admit: 2018-11-01 | Discharge: 2018-11-01 | Disposition: A | Discharge status: Home / Self Care | Payer: Medicare Other | Source: Ambulatory Visit | Attending: Pulmonary Disease | Admitting: Pulmonary Disease

## 2018-11-01 DIAGNOSIS — I700 Atherosclerosis of aorta: Secondary | ICD-10-CM | POA: Insufficient documentation

## 2018-11-01 DIAGNOSIS — R911 Solitary pulmonary nodule: Secondary | ICD-10-CM | POA: Diagnosis present

## 2018-11-01 DIAGNOSIS — I2510 Atherosclerotic heart disease of native coronary artery without angina pectoris: Secondary | ICD-10-CM | POA: Insufficient documentation

## 2018-11-01 LAB — GLUCOSE, CAPILLARY: Glucose-Capillary: 92 mg/dL (ref 70–99)

## 2018-11-01 MED ORDER — FLUDEOXYGLUCOSE F - 18 (FDG) INJECTION
10.1000 | Freq: Once | INTRAVENOUS | Status: AC | PRN
  Administered 2018-11-01: 10.99 via INTRAVENOUS

## 2018-11-02 ENCOUNTER — Ambulatory Visit: Payer: Medicare Other | Admitting: Cardiothoracic Surgery

## 2018-11-02 ENCOUNTER — Encounter: Payer: Self-pay | Admitting: Cardiothoracic Surgery

## 2018-11-02 ENCOUNTER — Other Ambulatory Visit: Payer: Self-pay

## 2018-11-02 VITALS — BP 124/80 | HR 84 | Temp 97.2°F | Resp 18 | Ht 73.0 in | Wt 189.0 lb

## 2018-11-02 DIAGNOSIS — R918 Other nonspecific abnormal finding of lung field: Secondary | ICD-10-CM | POA: Diagnosis not present

## 2018-11-02 NOTE — Addendum Note (Signed)
Addended by: Celene Kras on: 11/02/2018 08:56 AM   Modules accepted: Orders

## 2018-11-02 NOTE — Patient Instructions (Addendum)
We will send the referral to and Dr.Crystal. Someone from their office will contact you within 5-7 days to schedule an appointment.   If you do not hear from their office within the time frame please call our office.

## 2018-11-02 NOTE — Progress Notes (Signed)
Patient ID: Gary [name], male   DOB: January 08, 1930, 85 y.o.   MRN: [id number]  Chief Complaint  Patient presents with  . New Patient (Initial Visit)    Lung mass-referred Dr.Sonnenburg    Referred By Dr. Caryl Bis Reason for Referral right lower lobe mass  HPI Location, Quality, Duration, Severity, Timing, Context, Modifying Factors, Associated Signs and Symptoms.  Gary [name] is a 85 y.o. male.  His problems began back in the fall of the year when he experienced several episodes of falling.  On one occasion he fell particularly hard backwards and was taken to the emergency room in October.  His x-rays at that time showed a possible compression fracture but he did not improve and continued to have back pain and was again taken to the emergency room where a CT scan of the abdomen revealed a right lower lobe mass.  He then had a dedicated chest CT and was treated with antibiotics prior to that.  The CT scan revealed a right lower lobe mass most consistent with a bronchogenic carcinoma.  He had several areas of mediastinal lymphadenopathy that was mild and a subsequent PET scan performed revealed only the right lower lobe mass with a very questionable mediastinal lymph node at the subcarinal level.  He is a frail-appearing 85 year old and states that he is unable to walk up a flight of stairs.  He does get around with a walker.  He smoked off and on for pretty much most of his life.  He continues to smoke about a half a pack of cigarettes per day.  He has a family history of cancer in his father but he is not sure if he has had had lung cancer.  He worked as an Agricultural consultant.  There is no known asbestos exposure.  Today in the office were unable to obtain an oxygen saturation.   Past Medical History:  Diagnosis Date  . Atrial fibrillation ([place])   . AV block    s/p Pacemaker placement.  . Chicken pox   . History of blood transfusion   . Hyperlipidemia   . Hypertension     Past Surgical  History:  Procedure Laterality Date  . HERNIA REPAIR    . KNEE ARTHROSCOPY    . PACEMAKER PLACEMENT    . PROSTATECTOMY      Family History  Problem Relation Age of Onset  . Cancer Father   . Heart disease Father   . Cancer Brother     Social History Social History   Tobacco Use  . Smoking status: Current Some Day Smoker    Packs/day: 0.25    Years: 40.00    Pack years: 10.00    Types: Cigarettes  . Smokeless tobacco: Never Used  Substance Use Topics  . Alcohol use: Yes    Alcohol/week: 2.0 standard drinks    Types: 2 Standard drinks or equivalent per week  . Drug use: No    No Known Allergies  Current Outpatient Medications  Medication Sig Dispense Refill  . apixaban (ELIQUIS) 2.5 MG TABS tablet Take 1 tablet by mouth 2 (two) times daily.     . Multiple Vitamins-Minerals (PRESERVISION AREDS 2+MULTI VIT PO) Take 1 tablet by mouth 2 (two) times daily.    . traMADol (ULTRAM) 50 MG tablet Take 1 tablet (50 mg total) by mouth every 8 (eight) hours as needed for moderate pain. 60 tablet 0   No current facility-administered medications for this visit.  Review of Systems A complete review of systems was asked and was negative except for the following positive findings 20 to 25 pound weight loss over the last 6 months  Blood pressure 124/80, pulse 84, temperature (!) 97.2 F (36.2 C), temperature source Tympanic, resp. rate 18, height 6\' 1"  (1.854 m), weight 189 lb (85.7 kg).  Physical Exam CONSTITUTIONAL:  Pleasant, well-developed, well-nourished, and in no acute distress. EYES: Pupils equal and reactive to light, Sclera non-icteric EARS, NOSE, MOUTH AND THROAT:  The oropharynx was clear.  Dentition is good repair.  Oral mucosa pink and moist. LYMPH NODES:  Lymph nodes in the neck and axillae were normal RESPIRATORY:  Lungs were clear.  Normal respiratory effort without pathologic use of accessory muscles of respiration CARDIOVASCULAR: Heart was regular without  murmurs.  There were no carotid bruits. GI: The abdomen was soft, nontender, and nondistended. There were no palpable masses. There was no hepatosplenomegaly. There were normal bowel sounds in all quadrants. GU:  Rectal deferred.   MUSCULOSKELETAL:  Normal muscle strength and tone.  No clubbing or cyanosis.   SKIN:  There were no pathologic skin lesions.  There were no nodules on palpation. NEUROLOGIC:  Sensation is normal.  Cranial nerves are grossly intact. PSYCH:  Oriented to person, place and time.  Mood and affect are normal.  Data Reviewed CT scan and PET scan  I have personally reviewed the patient's imaging, laboratory findings and medical records.    Assessment    Right lower lobe mass most consistent with bronchogenic carcinoma    Plan    I explained to the patient and his family today that the CT scan and PET scan would suggest that the right lower lobe mass is most consistent with a bronchogenic carcinoma.  There is a questionable area in the mediastinum.  Therefore this may represent a stage I carcinoma of the lung and may be amenable to radiation therapy.  I do not believe that he is a candidate for surgery given his advanced age, poor performance status and associated comorbid conditions.  I will make an appointment for him to follow-up with Dr. Donella Stade.  The patient has already seen Dr. Lynett Fish and Dr. Jamal Collin in pulmonary [place], MD 11/02/2018, 8:29 AM

## 2018-11-12 ENCOUNTER — Encounter
Admission: RE | Admit: 2018-11-12 | Discharge: 2018-11-12 | Disposition: A | Discharge status: Home / Self Care | Payer: Medicare Other | Source: Ambulatory Visit | Attending: Orthopedic Surgery | Admitting: Orthopedic Surgery

## 2018-11-12 DIAGNOSIS — S32010A Wedge compression fracture of first lumbar vertebra, initial encounter for closed fracture: Secondary | ICD-10-CM

## 2018-11-12 DIAGNOSIS — S32020A Wedge compression fracture of second lumbar vertebra, initial encounter for closed fracture: Secondary | ICD-10-CM | POA: Diagnosis present

## 2018-11-12 MED ORDER — TECHNETIUM TC 99M MEDRONATE IV KIT
22.9100 | PACK | Freq: Once | INTRAVENOUS | Status: AC | PRN
  Administered 2018-11-12: 22.91 via INTRAVENOUS

## 2018-11-13 ENCOUNTER — Other Ambulatory Visit: Payer: Self-pay | Admitting: Orthopedic Surgery

## 2018-11-13 DIAGNOSIS — S32010A Wedge compression fracture of first lumbar vertebra, initial encounter for closed fracture: Secondary | ICD-10-CM

## 2018-11-13 DIAGNOSIS — S32020A Wedge compression fracture of second lumbar vertebra, initial encounter for closed fracture: Secondary | ICD-10-CM

## 2018-11-15 ENCOUNTER — Ambulatory Visit
Admission: RE | Admit: 2018-11-15 | Discharge: 2018-11-15 | Disposition: A | Discharge status: Home / Self Care | Payer: Medicare Other | Source: Ambulatory Visit | Attending: Radiation Oncology | Admitting: Radiation Oncology

## 2018-11-15 ENCOUNTER — Encounter: Payer: Self-pay | Admitting: Radiation Oncology

## 2018-11-15 ENCOUNTER — Other Ambulatory Visit: Payer: Self-pay

## 2018-11-15 VITALS — BP 116/77 | HR 64 | Temp 98.0°F | Resp 18

## 2018-11-15 DIAGNOSIS — I10 Essential (primary) hypertension: Secondary | ICD-10-CM | POA: Insufficient documentation

## 2018-11-15 DIAGNOSIS — C3431 Malignant neoplasm of lower lobe, right bronchus or lung: Secondary | ICD-10-CM | POA: Insufficient documentation

## 2018-11-15 DIAGNOSIS — E785 Hyperlipidemia, unspecified: Secondary | ICD-10-CM | POA: Insufficient documentation

## 2018-11-15 DIAGNOSIS — R634 Abnormal weight loss: Secondary | ICD-10-CM | POA: Insufficient documentation

## 2018-11-15 DIAGNOSIS — Z7901 Long term (current) use of anticoagulants: Secondary | ICD-10-CM | POA: Diagnosis not present

## 2018-11-15 DIAGNOSIS — I4430 Unspecified atrioventricular block: Secondary | ICD-10-CM | POA: Insufficient documentation

## 2018-11-15 DIAGNOSIS — F17210 Nicotine dependence, cigarettes, uncomplicated: Secondary | ICD-10-CM | POA: Insufficient documentation

## 2018-11-15 DIAGNOSIS — Z8781 Personal history of (healed) traumatic fracture: Secondary | ICD-10-CM | POA: Diagnosis not present

## 2018-11-15 DIAGNOSIS — Z809 Family history of malignant neoplasm, unspecified: Secondary | ICD-10-CM | POA: Diagnosis not present

## 2018-11-15 DIAGNOSIS — I4891 Unspecified atrial fibrillation: Secondary | ICD-10-CM | POA: Insufficient documentation

## 2018-11-15 DIAGNOSIS — R918 Other nonspecific abnormal finding of lung field: Secondary | ICD-10-CM

## 2018-11-15 NOTE — Consult Note (Signed)
NEW PATIENT EVALUATION  Name: Gary [name]  MRN: [id number]  Date:   11/15/2018     DOB: Apr 18, 1930   This 85 y.o. male patient presents to the clinic for initial evaluation of stage IB (T2 N0 M0) non-small cell lung cancer of the right lower lobe.  REFERRING PHYSICIAN: Leone Haven, MD  CHIEF COMPLAINT:  Chief Complaint  Patient presents with  . Lung Mass    Initial Eval    DIAGNOSIS: The encounter diagnosis was Right lower lobe lung mass.   PREVIOUS INVESTIGATIONS:  PET CT and CT scans reviewed Clinical notes reviewed  HPI: patient is an 85 year old male well known to our department have been treated his wife 10 years prior for breast cancer. He recently experienced a fall and a trip to the emergency room demonstrated a compression fracture of his spine. Workup including CT scan demonstrated a right lower lobe lesion. He was treated empirically with antibiotics although his CT scan again showed a right lower lobe mass consistent with bronchogenic carcinoma. PET CT was performed showing a metabolic activity in the right lower lobe mass again compatible with bronchogenic carcinoma. There was some subtle borderline uptake in subcarinal lymph node although this was equal to the blood pool level and in my opinion not representing metastatic disease. He has been seen by Dr. Faith [name] based on his age and frail appearance not thought to be a surgical candidate. He continues to smoke about a a half a pack of cigarettes a day. Patient is also scheduled for possible kyphoplasty of his back in the near future. He is seen today for radiation oncology opinion. He specifically denies cough hemoptysis or chest tightness he is having some subtle weight loss of unknown etiology.  PLANNED TREATMENT REGIMEN: I MRT hypofractionated course of radiation therapy  PAST MEDICAL HISTORY:  has a past medical history of Atrial fibrillation ([place]), AV block, Chicken pox, History of blood transfusion,  Hyperlipidemia, and Hypertension.    PAST SURGICAL HISTORY:  Past Surgical History:  Procedure Laterality Date  . HERNIA REPAIR    . KNEE ARTHROSCOPY    . PACEMAKER PLACEMENT    . PROSTATECTOMY      FAMILY HISTORY: family history includes Cancer in his brother and father; Heart disease in his father.  SOCIAL HISTORY:  reports that he has been smoking cigarettes. He has a 10.00 pack-year smoking history. He has never used smokeless tobacco. He reports current alcohol use of about 2.0 standard drinks of alcohol per week. He reports that he does not use drugs.  ALLERGIES: Patient has no known allergies.  MEDICATIONS:  Current Outpatient Medications  Medication Sig Dispense Refill  . apixaban (ELIQUIS) 2.5 MG TABS tablet Take 1 tablet by mouth 2 (two) times daily.     . Multiple Vitamins-Minerals (PRESERVISION AREDS 2+MULTI VIT PO) Take 1 tablet by mouth 2 (two) times daily.    . traMADol (ULTRAM) 50 MG tablet Take 1 tablet (50 mg total) by mouth every 8 (eight) hours as needed for moderate pain. 60 tablet 0   No current facility-administered medications for this encounter.     ECOG PERFORMANCE STATUS:  0 - Asymptomatic  REVIEW OF SYSTEMS:  Patient has some minor weight loss, fatigue, weakness, fever, chills or night sweats. Patient denies any loss of vision, blurred vision. Patient denies any ringing  of the ears or hearing loss. No irregular heartbeat. Patient denies heart murmur or history of fainting. Patient denies any chest pain or pain radiating to her  upper extremities. Patient denies any shortness of breath, difficulty breathing at night, cough or hemoptysis. Patient denies any swelling in the lower legs. Patient denies any nausea vomiting, vomiting of blood, or coffee ground material in the vomitus. Patient denies any stomach pain. Patient states has had normal bowel movements no significant constipation or diarrhea. Patient denies any dysuria, hematuria or significant nocturia.  Patient denies any problems walking, swelling in the joints or loss of balance. Patient denies any skin changes, loss of hair or loss of weight. Patient denies any excessive worrying or anxiety or significant depression. Patient denies any problems with insomnia. Patient denies excessive thirst, polyuria, polydipsia. Patient denies any swollen glands, patient denies easy bruising or easy bleeding. Patient denies any recent infections, allergies or URI. Patient "s visual fields have not changed significantly in recent time.    PHYSICAL EXAM: BP 116/77   [name] 64   Temp 98 F (36.7 C)   Resp 18  Frail-appearing wheelchair-bound male in NAD.Well-developed well-nourished patient in NAD. HEENT reveals PERLA, EOMI, discs not visualized.  Oral cavity is clear. No oral mucosal lesions are identified. Neck is clear without evidence of cervical or supraclavicular adenopathy. Lungs are clear to A&P. Cardiac examination is essentially unremarkable with regular rate and rhythm without murmur rub or thrill. Abdomen is benign with no organomegaly or masses noted. Motor sensory and DTR levels are equal and symmetric in the upper and lower extremities. Cranial nerves II through XII are grossly intact. Proprioception is intact. No peripheral adenopathy or edema is identified. No motor or sensory levels are noted. Crude visual fields are within normal range.  LABORATORY DATA: no pathology for review    RADIOLOGY RESULTS:CT scans and PET/CT scans reviewed and compatible with the above-stated findings   IMPRESSION: stage IB (T2 N0 M0) non-small cell lung cancer in the right lower lobe in frail 85 year old male  PLAN: at this time based on the opinion of radiology surgical oncology and radiation oncology I believe were dealing with a primary bronchogenic lung carcinoma of the right lower lobe. Based on his age I believe needle biopsy would be hazardous based on slight probability of pneumothorax which could be  devastating to this patient. I would treat this as a stage I non-small cell lung cancer with hypofractionated I MRT course of radiation therapy. I will plan on delivering 6000 cGy in 10 fractions. Patient also has a pacemaker and oriented has an appointment with his cardiologist in first week of February to check the status of his pacemaker. Risks and benefits of treatment including possible development of cough fatigue skin reaction alteration of blood counts all were described in detail to the patient. He seems to comprehend my treatment plan well. I have personally set up and ordered CT simulation for next week.  I would like to take this opportunity to thank you for allowing me to participate in the care of your patient.Noreene Filbert, MD

## 2018-11-21 ENCOUNTER — Ambulatory Visit
Admission: RE | Admit: 2018-11-21 | Discharge: 2018-11-21 | Disposition: A | Discharge status: Home / Self Care | Payer: Medicare Other | Source: Ambulatory Visit | Attending: Radiation Oncology | Admitting: Radiation Oncology

## 2018-11-21 DIAGNOSIS — Z510 Encounter for antineoplastic radiation therapy: Secondary | ICD-10-CM | POA: Diagnosis not present

## 2018-11-21 DIAGNOSIS — C3431 Malignant neoplasm of lower lobe, right bronchus or lung: Secondary | ICD-10-CM | POA: Diagnosis not present

## 2018-11-21 DIAGNOSIS — R918 Other nonspecific abnormal finding of lung field: Secondary | ICD-10-CM | POA: Diagnosis not present

## 2018-11-22 DIAGNOSIS — Z510 Encounter for antineoplastic radiation therapy: Secondary | ICD-10-CM | POA: Diagnosis not present

## 2018-11-29 ENCOUNTER — Other Ambulatory Visit: Payer: Self-pay | Admitting: *Deleted

## 2018-11-29 DIAGNOSIS — R918 Other nonspecific abnormal finding of lung field: Secondary | ICD-10-CM

## 2018-12-03 ENCOUNTER — Ambulatory Visit
Admission: RE | Admit: 2018-12-03 | Discharge: 2018-12-03 | Disposition: A | Discharge status: Home / Self Care | Payer: Medicare Other | Source: Ambulatory Visit | Attending: Radiation Oncology | Admitting: Radiation Oncology

## 2018-12-03 ENCOUNTER — Other Ambulatory Visit: Payer: Self-pay | Admitting: *Deleted

## 2018-12-03 ENCOUNTER — Encounter: Payer: Self-pay | Admitting: Hospice and Palliative Medicine

## 2018-12-03 ENCOUNTER — Inpatient Hospital Stay: Payer: Medicare Other | Attending: Hospice and Palliative Medicine | Admitting: Hospice and Palliative Medicine

## 2018-12-03 VITALS — BP 117/71 | HR 66 | Temp 97.1°F

## 2018-12-03 DIAGNOSIS — G893 Neoplasm related pain (acute) (chronic): Secondary | ICD-10-CM | POA: Insufficient documentation

## 2018-12-03 DIAGNOSIS — Z515 Encounter for palliative care: Secondary | ICD-10-CM | POA: Diagnosis present

## 2018-12-03 DIAGNOSIS — R54 Age-related physical debility: Secondary | ICD-10-CM | POA: Diagnosis not present

## 2018-12-03 DIAGNOSIS — C7951 Secondary malignant neoplasm of bone: Secondary | ICD-10-CM | POA: Diagnosis not present

## 2018-12-03 DIAGNOSIS — C3431 Malignant neoplasm of lower lobe, right bronchus or lung: Secondary | ICD-10-CM | POA: Insufficient documentation

## 2018-12-03 DIAGNOSIS — Z510 Encounter for antineoplastic radiation therapy: Secondary | ICD-10-CM | POA: Insufficient documentation

## 2018-12-03 DIAGNOSIS — C3492 Malignant neoplasm of unspecified part of left bronchus or lung: Secondary | ICD-10-CM

## 2018-12-03 DIAGNOSIS — R918 Other nonspecific abnormal finding of lung field: Secondary | ICD-10-CM | POA: Insufficient documentation

## 2018-12-03 MED ORDER — HYDROCODONE-ACETAMINOPHEN 5-325 MG PO TABS: 1.0000 | ORAL_TABLET | Freq: Four times a day (QID) | ORAL | 0 refills | Status: DC | PRN

## 2018-12-03 MED ORDER — NALOXONE HCL 4 MG/0.1ML NA LIQD: NASAL | 0 refills | Status: AC

## 2018-12-03 NOTE — Progress Notes (Signed)
[facility]  Telephone:([phone] [phone]   Name: Gary [name] Date: 12/03/2018 MRN: [id number]  DOB: 1930-02-05  Patient Care Team: Leone Haven, MD as PCP - General (Family Medicine)    REASON FOR CONSULTATION: Palliative Care consult requested for this 85 y.o. male with multiple medical problems including stage Ib non-small cell lung cancer right lower lobe.  Patient was evaluated recently in the ER following a fall.  Work-up that included a CT of the chest revealed a right lower lobe mass, ILD, CAD, and an L1 compression fracture.  Patient was subsequently evaluated by medical oncology and cardiothoracic surgery.  Patient was deemed not a surgical candidate based on his age and frailty.  He was referred to radiation oncology for RT.  Patient has had severe pain from his compression fracture.  He has been evaluated by Ortho and is pending kyphoplasty.  Patient was referred to the palliative care clinic by radiation oncology for help with management of his pain.   SOCIAL HISTORY:    Patient is married and lives at home with his wife.  Patient has 2 sons, one of whom lives locally and the other in the [place].  Patient retired from [employer].  ADVANCE DIRECTIVES:  Not on file  CODE STATUS:   PAST MEDICAL HISTORY: Past Medical History:  Diagnosis Date  . Atrial fibrillation ([place])   . AV block    s/p Pacemaker placement.  . Chicken pox   . History of blood transfusion   . Hyperlipidemia   . Hypertension     PAST SURGICAL HISTORY:  Past Surgical History:  Procedure Laterality Date  . HERNIA REPAIR    . KNEE ARTHROSCOPY    . PACEMAKER PLACEMENT    . PROSTATECTOMY      HEMATOLOGY/ONCOLOGY HISTORY:   No history exists.    ALLERGIES:  has No Known Allergies.  MEDICATIONS:  Current Outpatient Medications  Medication Sig Dispense Refill  . apixaban (ELIQUIS) 2.5 MG TABS  tablet Take 2.5 mg by mouth 2 (two) times daily.     . Multiple Vitamins-Minerals (PRESERVISION AREDS 2) CAPS Take 1 tablet by mouth 2 (two) times daily.    . traMADol (ULTRAM) 50 MG tablet Take 1 tablet (50 mg total) by mouth every 8 (eight) hours as needed for moderate pain. (Patient taking differently: Take 50 mg by mouth at bedtime. ) 60 tablet 0   No current facility-administered medications for this visit.     VITAL SIGNS: BP 117/71 (BP Location: Right Arm, Patient Position: Sitting, Cuff Size: Normal)   Pulse 66   Temp (!) 97.1 F (36.2 C) (Tympanic)  There were no vitals filed for this visit.  Estimated body mass index is 24.94 kg/m as calculated from the following:   Height as of 11/02/18: 6' 1"  (1.854 m).   Weight as of 11/02/18: 189 lb (85.7 kg).  LABS: CBC:    Component Value Date/Time   WBC 10.7 10/05/2018 1549   HGB 14.7 10/05/2018 1549   HGB 15.7 05/08/2015 1212   HCT 43.7 10/05/2018 1549   HCT 46.2 05/08/2015 1212   PLT 360 10/05/2018 1549   PLT 287 05/08/2015 1212   MCV 92.6 10/05/2018 1549   MCV 93 05/08/2015 1212   MCV 95 06/17/2014 1357   NEUTROABS 6,345 10/05/2018 1549   NEUTROABS 6.4 05/08/2015 1212   NEUTROABS 6.4 06/17/2014 1357   LYMPHSABS 2,836 10/05/2018 1549   LYMPHSABS 2.8  05/08/2015 1212   LYMPHSABS 2.4 06/17/2014 1357   MONOABS 1.1 (H) 09/18/2018 1505   MONOABS 1.0 06/17/2014 1357   EOSABS 460 10/05/2018 1549   EOSABS 0.8 (H) 05/08/2015 1212   EOSABS 0.7 06/17/2014 1357   BASOSABS 107 10/05/2018 1549   BASOSABS 0.1 05/08/2015 1212   BASOSABS 0.1 06/17/2014 1357   Comprehensive Metabolic Panel:    Component Value Date/Time   NA 137 10/05/2018 1549   NA 136 05/08/2015 1212   NA 126 (L) 06/09/2013 1044   K 5.0 10/05/2018 1549   K 4.9 06/09/2013 1044   CL 100 10/05/2018 1549   CL 98 06/09/2013 1044   CO2 26 10/05/2018 1549   CO2 24 06/09/2013 1044   BUN 15 10/05/2018 1549   BUN 15 05/08/2015 1212   BUN 14 06/09/2013 1044    CREATININE 0.75 10/05/2018 1549   GLUCOSE 87 10/05/2018 1549   GLUCOSE 97 06/09/2013 1044   CALCIUM 9.7 10/05/2018 1549   CALCIUM 9.4 06/09/2013 1044   AST 14 10/22/2018 0946   AST 25 06/09/2013 1044   ALT 12 10/22/2018 0946   ALT 28 06/09/2013 1044   ALKPHOS 149 (H) 10/22/2018 0946   ALKPHOS 65 06/09/2013 1044   BILITOT 0.7 10/22/2018 0946   BILITOT 0.7 05/08/2015 1212   BILITOT 0.7 06/09/2013 1044   PROT 6.7 10/22/2018 0946   PROT 7.2 05/08/2015 1212   PROT 7.9 06/09/2013 1044   ALBUMIN 3.9 10/22/2018 0946   ALBUMIN 4.4 05/08/2015 1212   ALBUMIN 4.1 06/09/2013 1044    RADIOGRAPHIC STUDIES: Nm Bone W/spect  Result Date: 11/12/2018 CLINICAL DATA:  Compression fractures of L1 and L2 vertebra. EXAM: NM BONE SCAN AND SPECT IMAGING TECHNIQUE: After intravenous injection of radiopharmaceutical, delayed planar images were obtained in multiple projections. Additionally, delayed triplanar SPECT images were obtained through the area of interest. RADIOPHARMACEUTICALS:  22.91 mCi Tc-27mMDP COMPARISON:  09/19/2018 FINDINGS: There is slight misregistration artifact between the SPECT images and the corresponding CT images. On the CT portion of the exam the bones are noted to be diffusely osteopenic. Again seen are compression deformities involving the L1 and L2 vertebra. New compression fracture involving the L5 vertebra is identified with loss of approximately 15% of the vertebral body height. There is increased sclerosis associated with the L1 and L2 vertebra. On the corresponding SPECT images there is increased radiotracer uptake localizing to the L1, L2 and L5 compression deformities. Incidentally noted is extensive chronic lung disease with right lower lobe lung mass characterized on PET-CT from 11/01/2018. Aortic atherosclerosis is again noted. IMPRESSION: 1. Increased radiotracer uptake localizes to the L1 and L2 compression fractures seen on exam from 09/19/2018. There is also increased  radiotracer uptake localizing to a new L5 compression fracture. Here, there is loss of approximately 15% of the vertebral body height. 2. Right lung mass as described on PET-CT from 11/01/2018. Electronically Signed   By: TKerby MoorsM.D.   On: 11/12/2018 15:32    PERFORMANCE STATUS (ECOG) : 2 - Symptomatic, <50% confined to bed  Review of Systems As noted above. Otherwise, a complete review of systems is negative.  Physical Exam General: Frail-appearing in wheelchair Cardiovascular: regular rate and rhythm Pulmonary: clear ant fields Abdomen: soft, nontender, + bowel sounds GU: no suprapubic tenderness Extremities: no edema, no joint deformities Skin: no rashes Neurological: Weakness but otherwise nonfocal  IMPRESSION: I met with patient, wife, and son in the clinic today.  Patient had a simulation study for RT and  was referred to the clinic due to intractable pain.  Patient had difficulty lying flat due to pain.  Patient describes a sharp pain in the lower back that radiates out to the side.  He says the pain is worse with movement.  He rates the pain 7 out of 10 but will drop to of 4 out of 10 with rest.  Pain is keeping him awake at night and limiting his functioning during the day.  He was prescribed tramadol by his PCP and was taking that at bedtime.  Family say that he was only given 8 tablets and is out of his medication.  However, patient says the tramadol was ineffective at lowering his pain.  Patient is reportedly scheduled for vertebral augmentation procedure on 2/25.  Suspect that he will need a more potent short acting opiate until he can have his kyphoplasty.  Will discontinue tramadol and start Norco 5 325 mg every 6 hours as needed for pain  Recommended starting bowel regimen with daily Senokot as needed for constipation.  [place] CSRS reviewed. No records found.   PLAN: -Discontinue tramadol -Start Norco 5-358m Q6H PRN for pain (rx #45) -Bowel regimen with senna daily  prn for constipation -Naloxone Kit -RTC in 1 week  Patient expressed understanding and was in agreement with this plan. He also understands that He can call clinic at any time with any questions, concerns, or complaints.   Time Total: 30 minutes  Visit consisted of counseling and education dealing with the complex and emotionally intense issues of symptom management and palliative care in the setting of serious and potentially life-threatening illness.Greater than 50%  of this time was spent counseling and coordinating care related to the above assessment and plan.  Signed by: JAltha Harm PhD, NP-C [phone](Work Cell)

## 2018-12-04 ENCOUNTER — Ambulatory Visit
Admission: RE | Admit: 2018-12-04 | Discharge: 2018-12-04 | Disposition: A | Discharge status: Home / Self Care | Payer: Medicare Other | Source: Ambulatory Visit | Attending: Radiation Oncology | Admitting: Radiation Oncology

## 2018-12-04 DIAGNOSIS — Z510 Encounter for antineoplastic radiation therapy: Secondary | ICD-10-CM | POA: Diagnosis not present

## 2018-12-04 DIAGNOSIS — R918 Other nonspecific abnormal finding of lung field: Secondary | ICD-10-CM | POA: Diagnosis not present

## 2018-12-04 DIAGNOSIS — C3431 Malignant neoplasm of lower lobe, right bronchus or lung: Secondary | ICD-10-CM | POA: Diagnosis not present

## 2018-12-05 ENCOUNTER — Ambulatory Visit
Admission: RE | Admit: 2018-12-05 | Discharge: 2018-12-05 | Disposition: A | Discharge status: Home / Self Care | Payer: Medicare Other | Source: Ambulatory Visit | Attending: Radiation Oncology | Admitting: Radiation Oncology

## 2018-12-05 DIAGNOSIS — Z510 Encounter for antineoplastic radiation therapy: Secondary | ICD-10-CM | POA: Diagnosis not present

## 2018-12-06 ENCOUNTER — Other Ambulatory Visit: Payer: Self-pay

## 2018-12-06 ENCOUNTER — Encounter
Admission: RE | Admit: 2018-12-06 | Discharge: 2018-12-06 | Disposition: A | Discharge status: Home / Self Care | Payer: Medicare Other | Source: Ambulatory Visit | Attending: Orthopedic Surgery | Admitting: Orthopedic Surgery

## 2018-12-06 ENCOUNTER — Ambulatory Visit
Admission: RE | Admit: 2018-12-06 | Discharge: 2018-12-06 | Disposition: A | Discharge status: Home / Self Care | Payer: Medicare Other | Source: Ambulatory Visit | Attending: Radiation Oncology | Admitting: Radiation Oncology

## 2018-12-06 DIAGNOSIS — Z950 Presence of cardiac pacemaker: Secondary | ICD-10-CM

## 2018-12-06 DIAGNOSIS — Z01810 Encounter for preprocedural cardiovascular examination: Secondary | ICD-10-CM | POA: Diagnosis not present

## 2018-12-06 DIAGNOSIS — Z01818 Encounter for other preprocedural examination: Secondary | ICD-10-CM | POA: Diagnosis present

## 2018-12-06 DIAGNOSIS — R9431 Abnormal electrocardiogram [ECG] [EKG]: Secondary | ICD-10-CM | POA: Insufficient documentation

## 2018-12-06 DIAGNOSIS — Z510 Encounter for antineoplastic radiation therapy: Secondary | ICD-10-CM | POA: Diagnosis not present

## 2018-12-06 HISTORY — DX: Malignant (primary) neoplasm, unspecified: C80.1

## 2018-12-06 HISTORY — DX: Presence of cardiac pacemaker: Z95.0

## 2018-12-06 LAB — BASIC METABOLIC PANEL
Anion gap: 4 — ABNORMAL LOW (ref 5–15)
BUN: 21 mg/dL (ref 8–23)
CO2: 29 mmol/L (ref 22–32)
Calcium: 9.1 mg/dL (ref 8.9–10.3)
Chloride: 102 mmol/L (ref 98–111)
Creatinine, Ser: 0.66 mg/dL (ref 0.61–1.24)
GFR calc Af Amer: >60 mL/min (ref >60)
GFR calc non Af Amer: >60 mL/min (ref >60)
GLUCOSE: 100 mg/dL — AB (ref 70–99)
Potassium: 4.3 mmol/L (ref 3.5–5.1)
Sodium: 135 mmol/L (ref 135–145)

## 2018-12-06 LAB — CBC
HCT: 42.9 % (ref 39.0–52.0)
Hemoglobin: 14 g/dL (ref 13.0–17.0)
MCH: 30.8 pg (ref 26.0–34.0)
MCHC: 32.6 g/dL (ref 30.0–36.0)
MCV: 94.5 fL (ref 80.0–100.0)
Platelets: 308 10*3/uL (ref 150–400)
RBC: 4.54 MIL/uL (ref 4.22–5.81)
RDW: 14.1 % (ref 11.5–15.5)
WBC: 7.9 10*3/uL (ref 4.0–10.5)
nRBC: 0 % (ref 0.0–0.2)

## 2018-12-06 NOTE — Patient Instructions (Addendum)
INSTRUCTIONS FOR SURGERY     Your surgery is scheduled for:  Thursday, February 27th      To find out your arrival time for the day of surgery,          please call [phone] between 1 pm and 3 pm on : Wednesday, February 26th     When you arrive for surgery, report to the [facility].       Do NOT stop on the first floor to register.    REMEMBER: Instructions that are not followed completely may result in serious medical risk,  up to and including death, or upon the discretion of your surgeon and anesthesiologist,            your surgery may need to be rescheduled.  __X__ 1. Do not eat food after midnight the night before your procedure.                    No gum, candy, lozenger, tic tacs, tums or hard candies.                  ABSOLUTELY NOTHING SOLID IN YOUR MOUTH AFTER MIDNIGHT                    You may drink unlimited clear liquids up to 2 hours before you are scheduled                       to arrive for surgery. Do not drink anything within those 2 hours unless                      You need to take medicine, then take the smallest amount you need.                   Clear liquids include:  water, apple juice without pulp, any flavor Gatorade,                        Black coffee, black tea.  Sugar may be added but no dairy/ honey /lemon.                        Broth and jello is not considered a clear liquid.  __x__  2. On the morning of surgery, please brush your teeth with toothpaste and water.                       You may rinse with mouthwash if you wish but                           DO NOT SWALLOW TOOTHPASTE OR MOUTHWASH  __X___3. NO alcohol for 24 hours before or after surgery.  __x___ 4.  Do NOT smoke or use e-cigarettes for 24 HOURS PRIOR TO SURGERY.                      DO NOT Use any chewable tobacco products for at least 6 hours prior to surgery.  __x___ 5. If you start any new medication after this  appointment and prior to surgery, please  Bring it with you on the day of surgery.  ___x__ 6. Notify your doctor if there is any change in your medical condition, such as                   Fever, infection, vomitting, diarrhea.  __x___ 7.  USE the CHG SOAP as instructed, the night before surgery and the day of surgery.                    Once you have washed with this soap, do NOT use any of the following:                      Powders, perfumes, lotions, make up, hairpins, clips or nail polish.                     You _______   wear deodorant.                      Men may shave their face and neck. Women need to shave 48 hours prior to surgery.                     DO NOT wear ANY jewelry on the day of surgery. If there are rings that are too                        Tight to remove easily, please address this prior to the surgery date.                     Piercings need to be removed.   NO METAL ON YOUR BODY.                     Do NOT bring any valuables.  If you came to Pre-Admit testing then you will not                       Need license, insurance card or credit card.                       If you will be staying overnight, please either leave your things in the car or have                        Your family be responsible for this.                      [facility] IS NOT RESPONSIBLE FOR BELONGINGS OR VALUABLES.  ___X__ 8. DO NOT wear contact lenses on surgery day.  You may not have dentures,                     Hearing aides, contacts or glasses in the operating room. These items can be                    Placed in the Recovery Room to receive immediately after surgery.  __x___ 9. IF YOU ARE SCHEDULED TO GO HOME ON THE SAME DAY, YOU MUST                   Have someone to drive you home and to stay with you  for the first 24 hours.  Have an arrangement prior to arriving on surgery day.  _____ 10. Take the following medications on the morning of surgery  with a sip of water:                              1. Pain medication if needed on day of surgery                     2.                     3.                     4.                     5.                     6.  _____ 11.  Follow any instructions provided to you by your surgeon.                        Such as enema, clear liquid bowel prep  __X__  12. STOP COUMADIN / PLAVIX / ELIQUIS / ASPIRIN AS OF:                       THIS INCLUDES BC POWDERS / GOODIES POWDER  __x___ 13. STOP Anti-inflammatories as of:                       This includes IBUPROFEN / MOTRIN / ADVIL / ALEVE/ NAPROXYN                    YOU MAY TAKE TYLENOL ANY TIME PRIOR TO SURGERY.  _____ 47.  Stop supplements until after surgery.                     This includes: preservision areds. Stop one week prior to surgery.                  You may continue taking Vitamin B12 / Vitamin D3 but do not take on the                   Morning of surgery.  _____ 15. Bring your CPAP machine into preop with you on the morning of surgery.  Wear loose and comfortable clothes to the hospital. Have stool softeners available for home use. If you have your power of attorney papers, please bring with you so we can make a copy for your chart.  PLEASE CALL DR. Nehemiah Massed TO DETERMINE WHEN IT IS ACCEPTABLE TO STOP THE ELIQUIS.     MUST BE 2 DAYS OR MORE .

## 2018-12-07 ENCOUNTER — Ambulatory Visit
Admission: RE | Admit: 2018-12-07 | Discharge: 2018-12-07 | Disposition: A | Discharge status: Home / Self Care | Payer: Medicare Other | Source: Ambulatory Visit | Attending: Radiation Oncology | Admitting: Radiation Oncology

## 2018-12-07 DIAGNOSIS — Z510 Encounter for antineoplastic radiation therapy: Secondary | ICD-10-CM | POA: Diagnosis not present

## 2018-12-10 ENCOUNTER — Telehealth: Payer: Self-pay | Admitting: Family Medicine

## 2018-12-10 ENCOUNTER — Ambulatory Visit
Admission: RE | Admit: 2018-12-10 | Discharge: 2018-12-10 | Disposition: A | Discharge status: Home / Self Care | Payer: Medicare Other | Source: Ambulatory Visit | Attending: Radiation Oncology | Admitting: Radiation Oncology

## 2018-12-10 ENCOUNTER — Inpatient Hospital Stay: Payer: Medicare Other

## 2018-12-10 ENCOUNTER — Inpatient Hospital Stay (HOSPITAL_BASED_OUTPATIENT_CLINIC_OR_DEPARTMENT_OTHER): Payer: Medicare Other | Admitting: Hospice and Palliative Medicine

## 2018-12-10 VITALS — BP 108/76 | HR 60 | Temp 96.6°F | Resp 18

## 2018-12-10 DIAGNOSIS — C3491 Malignant neoplasm of unspecified part of right bronchus or lung: Secondary | ICD-10-CM | POA: Diagnosis not present

## 2018-12-10 DIAGNOSIS — Z510 Encounter for antineoplastic radiation therapy: Secondary | ICD-10-CM | POA: Diagnosis not present

## 2018-12-10 DIAGNOSIS — R52 Pain, unspecified: Secondary | ICD-10-CM

## 2018-12-10 DIAGNOSIS — R918 Other nonspecific abnormal finding of lung field: Secondary | ICD-10-CM

## 2018-12-10 DIAGNOSIS — S32010D Wedge compression fracture of first lumbar vertebra, subsequent encounter for fracture with routine healing: Secondary | ICD-10-CM

## 2018-12-10 DIAGNOSIS — G893 Neoplasm related pain (acute) (chronic): Secondary | ICD-10-CM | POA: Diagnosis not present

## 2018-12-10 DIAGNOSIS — C7951 Secondary malignant neoplasm of bone: Secondary | ICD-10-CM

## 2018-12-10 DIAGNOSIS — S32010S Wedge compression fracture of first lumbar vertebra, sequela: Secondary | ICD-10-CM

## 2018-12-10 DIAGNOSIS — Z515 Encounter for palliative care: Secondary | ICD-10-CM

## 2018-12-10 LAB — CBC
HCT: 46 % (ref 39.0–52.0)
Hemoglobin: 15.3 g/dL (ref 13.0–17.0)
MCH: 31.2 pg (ref 26.0–34.0)
MCHC: 33.3 g/dL (ref 30.0–36.0)
MCV: 93.9 fL (ref 80.0–100.0)
Platelets: 284 10*3/uL (ref 150–400)
RBC: 4.9 MIL/uL (ref 4.22–5.81)
RDW: 13.7 % (ref 11.5–15.5)
WBC: 10.3 10*3/uL (ref 4.0–10.5)
nRBC: 0 % (ref 0.0–0.2)

## 2018-12-10 NOTE — Progress Notes (Signed)
[facility]  Telephone:([phone] [phone]   Name: Gary [name] Date: 12/10/2018 MRN: [id number]  DOB: [birth year]  Patient Care Team: Leone Haven, MD as PCP - General (Family Medicine)    REASON FOR CONSULTATION: Palliative Care consult requested for this 85 y.o. male with multiple medical problems including stage Ib non-small cell lung cancer right lower lobe.  Patient was evaluated recently in the ER following a fall.  Work-up that included a CT of the chest revealed a right lower lobe mass, ILD, CAD, and an L1 compression fracture.  Patient was subsequently evaluated by medical oncology and cardiothoracic surgery.  Patient was deemed not a surgical candidate based on his age and frailty.  He was referred to radiation oncology for RT.  Patient has had severe pain from his compression fracture.  He has been evaluated by Ortho and is pending kyphoplasty.  Patient was referred to the palliative care clinic by radiation oncology for help with management of his pain.   SOCIAL HISTORY:    Patient is married and lives at home with his wife.  Patient has 2 sons, one of whom lives locally and the other in the [place].  Patient retired from [employer].  ADVANCE DIRECTIVES:  Not on file  CODE STATUS:   PAST MEDICAL HISTORY: Past Medical History:  Diagnosis Date  . Atrial fibrillation ([place])   . AV block    s/p Pacemaker placement.  . Cancer ([place]) 11/2008   being treated with radiation for lung cancer  . Chicken pox   . History of blood transfusion    patient and wife do not recall ever receiving a blood transfusion  . Hyperlipidemia   . Hypertension   . Presence of permanent cardiac pacemaker     PAST SURGICAL HISTORY:  Past Surgical History:  Procedure Laterality Date  . EYE SURGERY Bilateral    cataract extractions with iol  . HERNIA REPAIR    . KNEE ARTHROSCOPY Left 2016  .  PACEMAKER PLACEMENT  2010   sss, AF  . PROSTATECTOMY  2008    HEMATOLOGY/ONCOLOGY HISTORY:   No history exists.    ALLERGIES:  has No Known Allergies.  MEDICATIONS:  Current Outpatient Medications  Medication Sig Dispense Refill  . apixaban (ELIQUIS) 2.5 MG TABS tablet Take 2.5 mg by mouth 2 (two) times daily.     Marland Kitchen HYDROcodone-acetaminophen (NORCO/VICODIN) 5-325 MG tablet Take 1 tablet by mouth every 6 (six) hours as needed for moderate pain or severe pain. 45 tablet 0  . Multiple Vitamins-Minerals (PRESERVISION AREDS 2) CAPS Take 1 tablet by mouth 2 (two) times daily.    . naloxone (NARCAN) nasal spray 4 mg/0.1 mL 1 spray into nostril upon signs of opioid overdose (unresponsiveness). Call 911. May repeat once if no response within 2-3 minutes. (Patient not taking: Reported on 12/10/2018) 1 kit 0  . traMADol (ULTRAM) 50 MG tablet Take 1 tablet (50 mg total) by mouth every 8 (eight) hours as needed for moderate pain. (Patient not taking: Reported on 12/10/2018) 60 tablet 0   No current facility-administered medications for this visit.     VITAL SIGNS: BP 108/76 (BP Location: Left Arm, Patient Position: Sitting)   Pulse 60   Temp (!) 96.6 F (35.9 C) (Tympanic)   Resp 18  Filed Weights    Estimated body mass index is 25.04 kg/m as calculated from the following:   Height as of 12/06/18: 6' 1.5" (  1.867 m).   Weight as of 12/06/18: 192 lb 7 oz (87.3 kg).  LABS: CBC:    Component Value Date/Time   WBC 10.3 12/10/2018 0951   HGB 15.3 12/10/2018 0951   HGB 15.7 05/08/2015 1212   HCT 46.0 12/10/2018 0951   HCT 46.2 05/08/2015 1212   PLT 284 12/10/2018 0951   PLT 287 05/08/2015 1212   MCV 93.9 12/10/2018 0951   MCV 93 05/08/2015 1212   MCV 95 06/17/2014 1357   NEUTROABS 6,345 10/05/2018 1549   NEUTROABS 6.4 05/08/2015 1212   NEUTROABS 6.4 06/17/2014 1357   LYMPHSABS 2,836 10/05/2018 1549   LYMPHSABS 2.8 05/08/2015 1212   LYMPHSABS 2.4 06/17/2014 1357   MONOABS 1.1 (H)  09/18/2018 1505   MONOABS 1.0 06/17/2014 1357   EOSABS 460 10/05/2018 1549   EOSABS 0.8 (H) 05/08/2015 1212   EOSABS 0.7 06/17/2014 1357   BASOSABS 107 10/05/2018 1549   BASOSABS 0.1 05/08/2015 1212   BASOSABS 0.1 06/17/2014 1357   Comprehensive Metabolic Panel:    Component Value Date/Time   NA 135 12/06/2018 1231   NA 136 05/08/2015 1212   NA 126 (L) 06/09/2013 1044   K 4.3 12/06/2018 1231   K 4.9 06/09/2013 1044   CL 102 12/06/2018 1231   CL 98 06/09/2013 1044   CO2 29 12/06/2018 1231   CO2 24 06/09/2013 1044   BUN 21 12/06/2018 1231   BUN 15 05/08/2015 1212   BUN 14 06/09/2013 1044   CREATININE 0.66 12/06/2018 1231   CREATININE 0.75 10/05/2018 1549   GLUCOSE 100 (H) 12/06/2018 1231   GLUCOSE 97 06/09/2013 1044   CALCIUM 9.1 12/06/2018 1231   CALCIUM 9.4 06/09/2013 1044   AST 14 10/22/2018 0946   AST 25 06/09/2013 1044   ALT 12 10/22/2018 0946   ALT 28 06/09/2013 1044   ALKPHOS 149 (H) 10/22/2018 0946   ALKPHOS 65 06/09/2013 1044   BILITOT 0.7 10/22/2018 0946   BILITOT 0.7 05/08/2015 1212   BILITOT 0.7 06/09/2013 1044   PROT 6.7 10/22/2018 0946   PROT 7.2 05/08/2015 1212   PROT 7.9 06/09/2013 1044   ALBUMIN 3.9 10/22/2018 0946   ALBUMIN 4.4 05/08/2015 1212   ALBUMIN 4.1 06/09/2013 1044    RADIOGRAPHIC STUDIES: Nm Bone W/spect  Result Date: 11/12/2018 CLINICAL DATA:  Compression fractures of L1 and L2 vertebra. EXAM: NM BONE SCAN AND SPECT IMAGING TECHNIQUE: After intravenous injection of radiopharmaceutical, delayed planar images were obtained in multiple projections. Additionally, delayed triplanar SPECT images were obtained through the area of interest. RADIOPHARMACEUTICALS:  22.91 mCi Tc-1mMDP COMPARISON:  09/19/2018 FINDINGS: There is slight misregistration artifact between the SPECT images and the corresponding CT images. On the CT portion of the exam the bones are noted to be diffusely osteopenic. Again seen are compression deformities involving the L1 and  L2 vertebra. New compression fracture involving the L5 vertebra is identified with loss of approximately 15% of the vertebral body height. There is increased sclerosis associated with the L1 and L2 vertebra. On the corresponding SPECT images there is increased radiotracer uptake localizing to the L1, L2 and L5 compression deformities. Incidentally noted is extensive chronic lung disease with right lower lobe lung mass characterized on PET-CT from 11/01/2018. Aortic atherosclerosis is again noted. IMPRESSION: 1. Increased radiotracer uptake localizes to the L1 and L2 compression fractures seen on exam from 09/19/2018. There is also increased radiotracer uptake localizing to a new L5 compression fracture. Here, there is loss of approximately 15% of the  vertebral body height. 2. Right lung mass as described on PET-CT from 11/01/2018. Electronically Signed   By: Kerby Moors M.D.   On: 11/12/2018 15:32    PERFORMANCE STATUS (ECOG) : 2 - Symptomatic, <50% confined to bed  Review of Systems As noted above. Otherwise, a complete review of systems is negative.  Physical Exam General: Frail-appearing in wheelchair Cardiovascular: regular rate and rhythm Pulmonary: clear ant fields Abdomen: soft, nontender, + bowel sounds Extremities: no edema Skin: no rashes Neurological: Weakness but otherwise nonfocal  IMPRESSION: I met with patient, wife, and son in the clinic today.  He was accompanied by his son.   Patient says Norco has not helped the pain. He is still rating pain as 7 out of 10 in the back. Pain is worse with movement. It is affecting his sleep.   Patient denies any adverse effects from pain medication such as drowsiness or confusion. No constipation reported.   Will increase Norco to 2 tablets (two 5-370m) Q6H PRN. We also discussed strategies such as bracing and future use of PT. I explained that the opioids were unlikely to completely relieve the pain but that they may hopefully allow him  to have improved functioning and allow him to tolerate RT treatments.   Patient is scheduled for kyphoplasty on 2/27.   It is my hope that patient would be able to wean from the opioids following the kyphoplasty. I called and spoke with Dr. SMarlaine Hindnurse (patient's PCP) about long term plan for pain management as this is not cancer-related pain. Additionally, patient would likely benefit from DXA scan if not already done, although clinical diagnosis of osteoporosis could likely be made due to fragility fracture. Patient may benefit from bisphosphonates.  I also spoke with Dr. BRogue Bussing Will schedule patient for follow up visit with him in mid-March.    PLAN: -Increase Norco to two tablets (5-3279m Q6H PRN for pain -Bowel regimen with senna daily prn for constipation -Scheduled f/u with medical oncology for March -Message left for PCP -RTC in 2 weeks  Patient expressed understanding and was in agreement with this plan. He also understands that He can call clinic at any time with any questions, concerns, or complaints.   Time Total: 20 minutes  Visit consisted of counseling and education dealing with the complex and emotionally intense issues of symptom management and palliative care in the setting of serious and potentially life-threatening illness.Greater than 50%  of this time was spent counseling and coordinating care related to the above assessment and plan.  Signed by: JoAltha HarmPhD, NP-C [phone] Cell)

## 2018-12-10 NOTE — Telephone Encounter (Signed)
I would prefer if the patient was referred to pain management.  I think it certainly would be worthwhile getting a bone density scan.  Please find out if the patient would be willing to undergo this.  If he would I can place an order.  Thanks.

## 2018-12-10 NOTE — Telephone Encounter (Signed)
Vonna Kotyk, NP put patient on Hydrocodone, while awaiting Kyroplasty for compression fractures this was done x 2 weeks ago patient is back in office today still in a lot of pain, NP is wanting to know since  this is not malignant pain would PCP want to take over pain medication or have patient referred to pain management until surgery.  Altha Harm NP [phone], NP also ask if patient would benefit from work up for osteoporosis and biphosphate treatment.

## 2018-12-11 ENCOUNTER — Ambulatory Visit: Payer: Medicare Other | Admitting: Hospice and Palliative Medicine

## 2018-12-11 ENCOUNTER — Ambulatory Visit
Admission: RE | Admit: 2018-12-11 | Discharge: 2018-12-11 | Disposition: A | Discharge status: Home / Self Care | Payer: Medicare Other | Source: Ambulatory Visit | Attending: Radiation Oncology | Admitting: Radiation Oncology

## 2018-12-11 DIAGNOSIS — Z510 Encounter for antineoplastic radiation therapy: Secondary | ICD-10-CM | POA: Diagnosis not present

## 2018-12-11 NOTE — Telephone Encounter (Signed)
Patient is taking radiation treatments on his lung and next Thursday he has the [facility] [address] , Patient ask could the Bone density be setup for March? Advised NP Altha Harm PCP wish for pain management to handle patients medications for his back pain.

## 2018-12-11 NOTE — Telephone Encounter (Signed)
Ordered dexa scan.

## 2018-12-12 ENCOUNTER — Ambulatory Visit
Admission: RE | Admit: 2018-12-12 | Discharge: 2018-12-12 | Disposition: A | Discharge status: Home / Self Care | Payer: Medicare Other | Source: Ambulatory Visit | Attending: Radiation Oncology | Admitting: Radiation Oncology

## 2018-12-12 DIAGNOSIS — Z510 Encounter for antineoplastic radiation therapy: Secondary | ICD-10-CM | POA: Diagnosis not present

## 2018-12-13 ENCOUNTER — Ambulatory Visit
Admission: RE | Admit: 2018-12-13 | Discharge: 2018-12-13 | Disposition: A | Discharge status: Home / Self Care | Payer: Medicare Other | Source: Ambulatory Visit | Attending: Radiation Oncology | Admitting: Radiation Oncology

## 2018-12-13 DIAGNOSIS — Z510 Encounter for antineoplastic radiation therapy: Secondary | ICD-10-CM | POA: Diagnosis not present

## 2018-12-14 ENCOUNTER — Ambulatory Visit: Payer: Medicare Other

## 2018-12-17 ENCOUNTER — Ambulatory Visit: Payer: Medicare Other

## 2018-12-17 ENCOUNTER — Ambulatory Visit
Admission: RE | Admit: 2018-12-17 | Discharge: 2018-12-17 | Disposition: A | Discharge status: Home / Self Care | Payer: Medicare Other | Source: Ambulatory Visit | Attending: Radiation Oncology | Admitting: Radiation Oncology

## 2018-12-17 DIAGNOSIS — Z510 Encounter for antineoplastic radiation therapy: Secondary | ICD-10-CM | POA: Diagnosis not present

## 2018-12-18 ENCOUNTER — Ambulatory Visit
Admission: RE | Admit: 2018-12-18 | Discharge: 2018-12-18 | Disposition: A | Discharge status: Home / Self Care | Payer: Medicare Other | Source: Ambulatory Visit | Attending: Radiation Oncology | Admitting: Radiation Oncology

## 2018-12-18 DIAGNOSIS — Z510 Encounter for antineoplastic radiation therapy: Secondary | ICD-10-CM | POA: Diagnosis not present

## 2018-12-19 MED ORDER — CEFAZOLIN SODIUM-DEXTROSE 2-4 GM/100ML-% IV SOLN
2.0000 g | Freq: Once | INTRAVENOUS | Status: AC
  Administered 2018-12-20: 2 g via INTRAVENOUS

## 2018-12-20 ENCOUNTER — Ambulatory Visit
Admission: RE | Admit: 2018-12-20 | Discharge: 2018-12-20 | Disposition: A | Discharge status: Home / Self Care | Payer: Medicare Other | Source: Ambulatory Visit | Attending: Orthopedic Surgery | Admitting: Orthopedic Surgery

## 2018-12-20 ENCOUNTER — Ambulatory Visit: Payer: Medicare Other | Admitting: Certified Registered"

## 2018-12-20 ENCOUNTER — Encounter: Payer: Self-pay | Admitting: *Deleted

## 2018-12-20 ENCOUNTER — Ambulatory Visit: Payer: Medicare Other

## 2018-12-20 ENCOUNTER — Other Ambulatory Visit: Payer: Self-pay

## 2018-12-20 ENCOUNTER — Encounter
Admission: RE | Disposition: A | Discharge status: Home / Self Care | Payer: Self-pay | Source: Ambulatory Visit | Attending: Orthopedic Surgery

## 2018-12-20 DIAGNOSIS — F17210 Nicotine dependence, cigarettes, uncomplicated: Secondary | ICD-10-CM | POA: Diagnosis not present

## 2018-12-20 DIAGNOSIS — E785 Hyperlipidemia, unspecified: Secondary | ICD-10-CM | POA: Diagnosis not present

## 2018-12-20 DIAGNOSIS — S32010A Wedge compression fracture of first lumbar vertebra, initial encounter for closed fracture: Secondary | ICD-10-CM | POA: Diagnosis present

## 2018-12-20 DIAGNOSIS — Z961 Presence of intraocular lens: Secondary | ICD-10-CM | POA: Diagnosis not present

## 2018-12-20 DIAGNOSIS — W19XXXA Unspecified fall, initial encounter: Secondary | ICD-10-CM | POA: Insufficient documentation

## 2018-12-20 DIAGNOSIS — J8410 Pulmonary fibrosis, unspecified: Secondary | ICD-10-CM | POA: Diagnosis not present

## 2018-12-20 DIAGNOSIS — Z9841 Cataract extraction status, right eye: Secondary | ICD-10-CM | POA: Insufficient documentation

## 2018-12-20 DIAGNOSIS — S32050A Wedge compression fracture of fifth lumbar vertebra, initial encounter for closed fracture: Secondary | ICD-10-CM | POA: Diagnosis not present

## 2018-12-20 DIAGNOSIS — I10 Essential (primary) hypertension: Secondary | ICD-10-CM | POA: Diagnosis not present

## 2018-12-20 DIAGNOSIS — Z9842 Cataract extraction status, left eye: Secondary | ICD-10-CM | POA: Diagnosis not present

## 2018-12-20 DIAGNOSIS — Z7901 Long term (current) use of anticoagulants: Secondary | ICD-10-CM | POA: Diagnosis not present

## 2018-12-20 DIAGNOSIS — Z79899 Other long term (current) drug therapy: Secondary | ICD-10-CM | POA: Insufficient documentation

## 2018-12-20 DIAGNOSIS — Z419 Encounter for procedure for purposes other than remedying health state, unspecified: Secondary | ICD-10-CM

## 2018-12-20 DIAGNOSIS — Z8546 Personal history of malignant neoplasm of prostate: Secondary | ICD-10-CM | POA: Diagnosis not present

## 2018-12-20 DIAGNOSIS — Z510 Encounter for antineoplastic radiation therapy: Secondary | ICD-10-CM | POA: Diagnosis not present

## 2018-12-20 DIAGNOSIS — Z85118 Personal history of other malignant neoplasm of bronchus and lung: Secondary | ICD-10-CM | POA: Insufficient documentation

## 2018-12-20 DIAGNOSIS — Z79891 Long term (current) use of opiate analgesic: Secondary | ICD-10-CM | POA: Diagnosis not present

## 2018-12-20 DIAGNOSIS — I4891 Unspecified atrial fibrillation: Secondary | ICD-10-CM | POA: Insufficient documentation

## 2018-12-20 DIAGNOSIS — Z950 Presence of cardiac pacemaker: Secondary | ICD-10-CM | POA: Diagnosis not present

## 2018-12-20 HISTORY — PX: KYPHOPLASTY: SHX5884

## 2018-12-20 SURGERY — KYPHOPLASTY
Anesthesia: General

## 2018-12-20 MED ORDER — FENTANYL CITRATE (PF) 100 MCG/2ML IJ SOLN
INTRAMUSCULAR | Status: AC
  Filled 2018-12-20: qty 2

## 2018-12-20 MED ORDER — CEFAZOLIN SODIUM-DEXTROSE 2-4 GM/100ML-% IV SOLN
INTRAVENOUS | Status: AC
  Filled 2018-12-20: qty 100

## 2018-12-20 MED ORDER — PROPOFOL 10 MG/ML IV BOLUS
INTRAVENOUS | Status: DC | PRN
  Administered 2018-12-20 (×2): 20 mg via INTRAVENOUS

## 2018-12-20 MED ORDER — LIDOCAINE HCL 1 % IJ SOLN
INTRAMUSCULAR | Status: DC | PRN
  Administered 2018-12-20: 40 mL

## 2018-12-20 MED ORDER — ONDANSETRON HCL 4 MG/2ML IJ SOLN: 4.0000 mg | Freq: Four times a day (QID) | INTRAMUSCULAR | Status: DC | PRN

## 2018-12-20 MED ORDER — METOCLOPRAMIDE HCL 10 MG PO TABS: 5.0000 mg | ORAL_TABLET | Freq: Three times a day (TID) | ORAL | Status: DC | PRN

## 2018-12-20 MED ORDER — SODIUM CHLORIDE 0.9 % IV SOLN: INTRAVENOUS | Status: DC

## 2018-12-20 MED ORDER — FAMOTIDINE 20 MG PO TABS
20.0000 mg | ORAL_TABLET | Freq: Once | ORAL | Status: AC
  Administered 2018-12-20: 20 mg via ORAL

## 2018-12-20 MED ORDER — HYDROCODONE-ACETAMINOPHEN 5-325 MG PO TABS: 1.0000 | ORAL_TABLET | ORAL | Status: DC | PRN

## 2018-12-20 MED ORDER — BUPIVACAINE HCL (PF) 0.5 % IJ SOLN
INTRAMUSCULAR | Status: AC
  Filled 2018-12-20: qty 30

## 2018-12-20 MED ORDER — ONDANSETRON HCL 4 MG PO TABS: 4.0000 mg | ORAL_TABLET | Freq: Four times a day (QID) | ORAL | Status: DC | PRN

## 2018-12-20 MED ORDER — FENTANYL CITRATE (PF) 100 MCG/2ML IJ SOLN: 25.0000 ug | INTRAMUSCULAR | Status: DC | PRN

## 2018-12-20 MED ORDER — FAMOTIDINE 20 MG PO TABS
ORAL_TABLET | ORAL | Status: AC
  Filled 2018-12-20: qty 1

## 2018-12-20 MED ORDER — FENTANYL CITRATE (PF) 100 MCG/2ML IJ SOLN
INTRAMUSCULAR | Status: DC | PRN
  Administered 2018-12-20 (×2): 25 ug via INTRAVENOUS
  Administered 2018-12-20: 50 ug via INTRAVENOUS

## 2018-12-20 MED ORDER — EPINEPHRINE PF 1 MG/ML IJ SOLN
INTRAMUSCULAR | Status: AC
  Filled 2018-12-20: qty 1

## 2018-12-20 MED ORDER — HYDROCODONE-ACETAMINOPHEN 5-325 MG PO TABS: 1.0000 | ORAL_TABLET | Freq: Four times a day (QID) | ORAL | 0 refills | Status: DC | PRN

## 2018-12-20 MED ORDER — LACTATED RINGERS IV SOLN
INTRAVENOUS | Status: DC
  Administered 2018-12-20: 09:00:00 via INTRAVENOUS

## 2018-12-20 MED ORDER — PROPOFOL 500 MG/50ML IV EMUL
INTRAVENOUS | Status: DC | PRN
  Administered 2018-12-20: 75 ug/kg/min via INTRAVENOUS

## 2018-12-20 MED ORDER — METOCLOPRAMIDE HCL 5 MG/ML IJ SOLN: 5.0000 mg | Freq: Three times a day (TID) | INTRAMUSCULAR | Status: DC | PRN

## 2018-12-20 MED ORDER — BUPIVACAINE-EPINEPHRINE (PF) 0.5% -1:200000 IJ SOLN
INTRAMUSCULAR | Status: DC | PRN
  Administered 2018-12-20: 30 mL

## 2018-12-20 SURGICAL SUPPLY — 21 items
ADH SKN CLS APL DERMABOND .7 (GAUZE/BANDAGES/DRESSINGS) ×1
CEMENT KYPHON CX01A KIT/MIXER (Cement) ×5 IMPLANT
COVER WAND RF STERILE (DRAPES) ×3 IMPLANT
DERMABOND ADVANCED (GAUZE/BANDAGES/DRESSINGS) ×2
DERMABOND ADVANCED .7 DNX12 (GAUZE/BANDAGES/DRESSINGS) ×1 IMPLANT
DEVICE BIOPSY BONE KYPHX (INSTRUMENTS) ×3 IMPLANT
DRAPE C-ARM XRAY 36X54 (DRAPES) ×3 IMPLANT
DURAPREP 26ML APPLICATOR (WOUND CARE) ×3 IMPLANT
FEE RENTAL RFA GENERATOR (MISCELLANEOUS) IMPLANT
GLOVE SURG SYN 9.0  PF PI (GLOVE) ×2
GLOVE SURG SYN 9.0 PF PI (GLOVE) ×1 IMPLANT
GOWN SRG 2XL LVL 4 RGLN SLV (GOWNS) ×1 IMPLANT
GOWN STRL NON-REIN 2XL LVL4 (GOWNS) ×3
GOWN STRL REUS W/ TWL LRG LVL3 (GOWN DISPOSABLE) ×1 IMPLANT
GOWN STRL REUS W/TWL LRG LVL3 (GOWN DISPOSABLE) ×3
PACK KYPHOPLASTY (MISCELLANEOUS) ×3 IMPLANT
RENTAL RFA  GENERATOR (MISCELLANEOUS)
RENTAL RFA GENERATOR (MISCELLANEOUS) IMPLANT
STRAP SAFETY 5IN WIDE (MISCELLANEOUS) ×3 IMPLANT
TRAY KYPHOPAK 15/3 EXPRESS 1ST (MISCELLANEOUS) ×3 IMPLANT
TRAY KYPHOPAK 20/3 EXPRESS 1ST (MISCELLANEOUS) ×5 IMPLANT

## 2018-12-20 NOTE — Anesthesia Postprocedure Evaluation (Signed)
Anesthesia Post Note  Patient: Gary [name]  Procedure(s) Performed: KYPHOPLASTY L1, L2, L5 (N/A )  Patient location during evaluation: PACU Anesthesia Type: General Level of consciousness: awake and alert Pain management: pain level controlled Vital Signs Assessment: post-procedure vital signs reviewed and stable Respiratory status: spontaneous breathing, nonlabored ventilation, respiratory function stable and patient connected to nasal cannula oxygen Cardiovascular status: blood pressure returned to baseline and stable Postop Assessment: no apparent nausea or vomiting Anesthetic complications: no     Last Vitals:  Vitals:   12/20/18 1136 12/20/18 1200  BP: (!) 115/53 (!) 144/71  Pulse: 80   Resp:    Temp: (!) 36.4 C   SpO2: 100% 100%    Last Pain:  Vitals:   12/20/18 1136  TempSrc: Tympanic  PainSc: 0-No pain                 Durenda Hurt

## 2018-12-20 NOTE — H&P (Signed)
Reviewed paper H+P, will be scanned into chart. No changes noted.  

## 2018-12-20 NOTE — OR Nursing (Signed)
Discharge instructions discussed with pt and family. All voice understanding.

## 2018-12-20 NOTE — Op Note (Signed)
Date  12/20/2018  Time  10:51 am   PATIENT:  Gary [name]   PRE-OPERATIVE DIAGNOSIS:  closed wedge compression fracture of L1-L2 and L5   POST-OPERATIVE DIAGNOSIS:  closed wedge compression fracture of L1-L2 and L5   PROCEDURE:  Procedure(s): KYPHOPLASTY L1-L2 and L5  SURGEON: Laurene Footman, MD   ASSISTANTS: None   ANESTHESIA:   local and MAC   EBL:  No intake/output data recorded.   BLOOD ADMINISTERED:none   DRAINS: none    LOCAL MEDICATIONS USED:  MARCAINE    and XYLOCAINE    SPECIMEN:   L1-L2 and L5 vertebral body biopsies   DISPOSITION OF SPECIMEN:  Pathology   COUNTS:  YES   TOURNIQUET:  * No tourniquets in log *   IMPLANTS: Bone cement   DICTATION: .Dragon Dictation  patient was brought to the operating room and after adequate anesthesia was obtained the patient was placed prone.  C arm was brought in in good visualization of the affected level obtained on both AP and lateral projections.  After patient identification and timeout procedures were completed, local anesthetic was infiltrated with 10 cc 1% Xylocaine infiltrated subcutaneously.  This is done the area on the right side of the planned approach.  The back was then prepped and draped in the usual sterile manner and repeat timeout procedure carried out.  A spinal needle was brought down to the pedicle on the right side of  at each of the affected levels, L1-L2 and L5, and a 50-50 mix of 1% Xylocaine half percent Sensorcaine with epinephrine total of 20 cc injected.  After allowing this to set a small incision was made and the trocar was advanced into the vertebral body in an extrapedicular fashion.  Biopsy was obtained at each level Drilling was carried out balloon inserted with inflation to  about 1-1/2 cc at L1 and 1 cc at L2, 3 cc at L5.  When the cement was appropriate consistency for cc at L1, 2 cc at L to and 5 at L5 were injected into the vertebral body without extravasation, good fill superior to inferior  endplates and from right to left sides along the inferior endplate.  After the cement had set the trochar was removed and permanent C-arm views obtained.  The wound was closed with Dermabond followed by Band-Aid   PLAN OF CARE: Discharge to home after PACU   PATIENT DISPOSITION:  PACU - hemodynamically stable.

## 2018-12-20 NOTE — Anesthesia Post-op Follow-up Note (Signed)
Anesthesia QCDR form completed.        

## 2018-12-20 NOTE — Discharge Instructions (Signed)
Remove Band-Aids on Saturday then okay to shower.  Resume Eliquis tomorrow morning.  Pain medicine as needed.  Take it easy today and tomorrow then resume more normal activities on Saturday

## 2018-12-20 NOTE — Transfer of Care (Signed)
Immediate Anesthesia Transfer of Care Note  Patient: Gary [name]  Procedure(s) Performed: KYPHOPLASTY L1, L2, L5 (N/A )  Patient Location: PACU  Anesthesia Type:General  Level of Consciousness: awake  Airway & Oxygen Therapy: Patient Spontanous Breathing and Patient connected to nasal cannula oxygen  Post-op Assessment: Report given to RN and Post -op Vital signs reviewed and stable  Post vital signs: Reviewed and stable  Last Vitals:  Vitals Value Taken Time  BP    Temp    Pulse    Resp    SpO2      Last Pain:  Vitals:   12/20/18 0822  TempSrc: Oral  PainSc: 0-No pain         Complications: No apparent anesthesia complications

## 2018-12-20 NOTE — Anesthesia Preprocedure Evaluation (Addendum)
Anesthesia Evaluation  Patient identified by MRN, date of birth, ID band Patient awake    Reviewed: Allergy & Precautions, H&P , NPO status , Patient's Chart, lab work & pertinent test results  Airway Mallampati: III       Dental  (+) Edentulous Lower, Missing   Pulmonary Current Smoker,  Pulmonary fibrosis          Cardiovascular hypertension, + dysrhythmias (AV block s/p pacemaker) + pacemaker (2010)      Neuro/Psych negative neurological ROS  negative psych ROS   GI/Hepatic negative GI ROS, Neg liver ROS,   Endo/Other  negative endocrine ROS  Renal/GU negative Renal ROS  negative genitourinary   Musculoskeletal   Abdominal   Peds  Hematology negative hematology ROS (+)   Anesthesia Other Findings Past Medical History: No date: Atrial fibrillation ([place]) No date: AV block     Comment:  s/p Pacemaker placement. 11/2008: Cancer ([place])     Comment:  being treated with radiation for lung cancer No date: Chicken pox No date: History of blood transfusion     Comment:  patient and wife do not recall ever receiving a blood               transfusion No date: Hyperlipidemia No date: Hypertension No date: Presence of permanent cardiac pacemaker  Past Surgical History: No date: EYE SURGERY; Bilateral     Comment:  cataract extractions with iol No date: HERNIA REPAIR 2016: KNEE ARTHROSCOPY; Left 2010: PACEMAKER PLACEMENT     Comment:  sss, AF 2008: PROSTATECTOMY  BMI    Body Mass Index:  23.95 kg/m      Reproductive/Obstetrics negative OB ROS                            Anesthesia Physical Anesthesia Plan  ASA: III  Anesthesia Plan: General   Post-op Pain Management:    Induction:   PONV Risk Score and Plan: Propofol infusion and TIVA  Airway Management Planned: Natural Airway and Nasal Cannula  Additional Equipment:   Intra-op Plan:   Post-operative Plan:   Informed  Consent: I have reviewed the patients History and Physical, chart, labs and discussed the procedure including the risks, benefits and alternatives for the proposed anesthesia with the patient or authorized representative who has indicated his/her understanding and acceptance.     Dental Advisory Given  Plan Discussed with: Anesthesiologist and CRNA  Anesthesia Plan Comments: (Pt is pacemaker dependent.  Procedure is below the umbilicus.  Magnet will be immediately available.)       Anesthesia Quick Evaluation

## 2018-12-21 LAB — SURGICAL PATHOLOGY

## 2018-12-24 ENCOUNTER — Inpatient Hospital Stay: Payer: Medicare Other | Admitting: Hospice and Palliative Medicine

## 2019-01-07 ENCOUNTER — Encounter: Payer: Self-pay | Admitting: Family Medicine

## 2019-01-08 ENCOUNTER — Inpatient Hospital Stay: Payer: Medicare Other | Admitting: Internal Medicine

## 2019-01-23 ENCOUNTER — Ambulatory Visit: Payer: Medicare Other | Admitting: Radiation Oncology

## 2019-02-18 ENCOUNTER — Other Ambulatory Visit: Payer: Medicare Other

## 2019-02-20 ENCOUNTER — Ambulatory Visit: Payer: Medicare Other | Admitting: Radiation Oncology

## 2019-03-11 ENCOUNTER — Encounter (INDEPENDENT_AMBULATORY_CARE_PROVIDER_SITE_OTHER): Payer: Self-pay

## 2019-03-11 ENCOUNTER — Other Ambulatory Visit: Payer: Self-pay

## 2019-03-11 ENCOUNTER — Other Ambulatory Visit: Payer: Self-pay | Admitting: *Deleted

## 2019-03-11 ENCOUNTER — Ambulatory Visit
Admission: RE | Admit: 2019-03-11 | Discharge: 2019-03-11 | Disposition: A | Discharge status: Home / Self Care | Payer: Medicare Other | Source: Ambulatory Visit | Attending: Radiation Oncology | Admitting: Radiation Oncology

## 2019-03-11 ENCOUNTER — Encounter: Payer: Self-pay | Admitting: Radiation Oncology

## 2019-03-11 VITALS — BP 112/68 | HR 64 | Temp 96.3°F | Resp 18

## 2019-03-11 DIAGNOSIS — R918 Other nonspecific abnormal finding of lung field: Secondary | ICD-10-CM

## 2019-03-11 DIAGNOSIS — C3431 Malignant neoplasm of lower lobe, right bronchus or lung: Secondary | ICD-10-CM | POA: Diagnosis present

## 2019-03-11 DIAGNOSIS — Z923 Personal history of irradiation: Secondary | ICD-10-CM | POA: Diagnosis not present

## 2019-03-11 NOTE — Progress Notes (Signed)
Radiation Oncology Follow up Note  Name: Gary [name]   Date:   03/11/2019 MRN:  [id number] DOB: Mar 14, 1930    This 85 y.o. male presents to the clinic today for 24-month follow-up status post radiation therapy to his right lower lobe for stage Ib non-small cell lung cancer.  REFERRING PROVIDER: Leone Haven, MD  HPI: Patient is a an 85 year old male now about 3 months having completed external beam radiation therapy to his right lower lobe for stage Ib non-small cell lung cancer.  He is seen today in routine follow-up is doing fairly well specifically denies cough hemoptysis or chest tightness.  He is not had any present imaging..  Patient underwent back in the end of February a kyphoplasty for a wedge fracture of L1-2 and L5.  His back pain has improved.  His pathology was negative for malignancy at the time of kyphoplasty.  COMPLICATIONS OF TREATMENT: none  FOLLOW UP COMPLIANCE: keeps appointments   PHYSICAL EXAM:  BP 112/68 (BP Location: Left Arm, Patient Position: Sitting)   Pulse 64   Temp (!) 96.3 F (35.7 C) (Tympanic)   Resp 18  Elderly male wheelchair-bound in NAD.  Well-developed well-nourished patient in NAD. HEENT reveals PERLA, EOMI, discs not visualized.  Oral cavity is clear. No oral mucosal lesions are identified. Neck is clear without evidence of cervical or supraclavicular adenopathy. Lungs are clear to A&P. Cardiac examination is essentially unremarkable with regular rate and rhythm without murmur rub or thrill. Abdomen is benign with no organomegaly or masses noted. Motor sensory and DTR levels are equal and symmetric in the upper and lower extremities. Cranial nerves II through XII are grossly intact. Proprioception is intact. No peripheral adenopathy or edema is identified. No motor or sensory levels are noted. Crude visual fields are within normal range.  RADIOLOGY RESULTS: No current films for review  PLAN: Present time patient is stable no significant side  effects from his prior radiation therapy.  I have asked to see him back in 3 to 4 months with imaging by CT scan of his chest prior to visit.  Patient knows to call with any concerns at any time.  I would like to take this opportunity to thank you for allowing me to participate in the care of your patient.Noreene Filbert, MD

## 2019-03-11 NOTE — Progress Notes (Signed)
ct 

## 2019-03-29 ENCOUNTER — Ambulatory Visit
Admission: RE | Admit: 2019-03-29 | Discharge: 2019-03-29 | Disposition: A | Discharge status: Home / Self Care | Payer: Medicare Other | Source: Ambulatory Visit | Attending: Family Medicine | Admitting: Family Medicine

## 2019-03-29 ENCOUNTER — Other Ambulatory Visit: Payer: Self-pay

## 2019-03-29 DIAGNOSIS — M810 Age-related osteoporosis without current pathological fracture: Secondary | ICD-10-CM | POA: Insufficient documentation

## 2019-03-29 DIAGNOSIS — S32010D Wedge compression fracture of first lumbar vertebra, subsequent encounter for fracture with routine healing: Secondary | ICD-10-CM | POA: Diagnosis present

## 2019-04-09 ENCOUNTER — Ambulatory Visit (INDEPENDENT_AMBULATORY_CARE_PROVIDER_SITE_OTHER): Payer: Medicare Other | Admitting: Family Medicine

## 2019-04-09 ENCOUNTER — Encounter: Payer: Self-pay | Admitting: Family Medicine

## 2019-04-09 ENCOUNTER — Other Ambulatory Visit: Payer: Self-pay

## 2019-04-09 DIAGNOSIS — M25552 Pain in left hip: Secondary | ICD-10-CM

## 2019-04-09 DIAGNOSIS — C3490 Malignant neoplasm of unspecified part of unspecified bronchus or lung: Secondary | ICD-10-CM | POA: Diagnosis not present

## 2019-04-09 DIAGNOSIS — M25551 Pain in right hip: Secondary | ICD-10-CM | POA: Diagnosis not present

## 2019-04-09 DIAGNOSIS — M8000XD Age-related osteoporosis with current pathological fracture, unspecified site, subsequent encounter for fracture with routine healing: Secondary | ICD-10-CM | POA: Diagnosis not present

## 2019-04-09 DIAGNOSIS — S32000D Wedge compression fracture of unspecified lumbar vertebra, subsequent encounter for fracture with routine healing: Secondary | ICD-10-CM | POA: Diagnosis not present

## 2019-04-09 DIAGNOSIS — M810 Age-related osteoporosis without current pathological fracture: Secondary | ICD-10-CM | POA: Insufficient documentation

## 2019-04-09 NOTE — Assessment & Plan Note (Addendum)
Discussed treating with Fosamax.  We will have him come in for a vitamin D and calcium check then determine start date for Fosamax.  I did discuss that it is very important that he follows the directions for taking the Fosamax as there is some risk of esophageal and gastric irritation and ulcer.

## 2019-04-09 NOTE — Assessment & Plan Note (Signed)
I suspect this is related to osteoarthritis or could be related to his compression fractures in his back.  Prior x-rays with minimal degenerative change.  Repeat x-rays.

## 2019-04-09 NOTE — Assessment & Plan Note (Signed)
I did encourage the patient to consider follow-up with orthopedics.  We will prescribe tramadol to use at night for pain.  He will monitor for drowsiness with this.

## 2019-04-09 NOTE — Progress Notes (Signed)
Virtual Visit via telephone Note  This visit type was conducted due to national recommendations for restrictions regarding the COVID-19 pandemic (e.g. social distancing).  This format is felt to be most appropriate for this patient at this time.  All issues noted in this document were discussed and addressed.  No physical exam was performed (except for noted visual exam findings with Video Visits).   I connected with Gary [name] today at  3:30 PM EDT by telephone and verified that I am speaking with the correct person using two identifiers. Location patient: home Location provider: work Persons participating in the virtual visit: patient, provider, Taher Vannote (wife)  I discussed the limitations, risks, security and privacy concerns of performing an evaluation and management service by telephone and the availability of in person appointments. I also discussed with the patient that there may be a patient responsible charge related to this service. The patient expressed understanding and agreed to proceed.  Interactive audio and video telecommunications were attempted between this provider and patient, however failed, due to patient having technical difficulties OR patient did not have access to video capability.  We continued and completed visit with audio only.  Reason for visit: Follow-up.  HPI: Osteoporosis: Recently had a DEXA scan revealing osteoporosis.  He has had multiple compression fractures and on review of his chart it appears he had a new L3 and L4 compression fracture on his most recent evaluation with orthopedics.  He notes no history of stomach ulcers or esophageal ulcers.  No issues with sitting upright after taking a bisphosphonate.  No trouble swallowing.  Lumbar spine compression fractures: Continues to have some back pain related to this.  He has been taking Tylenol with some benefit.  Tramadol was helpful and was mostly taking this at night.  It would make him slightly  drowsy.  No alcohol intake with this.  He has not had any significant change in his back pain since his kyphoplasty.  Bilateral hip pain: He notes this has been going on for about 3 months.  Right greater than left hip pain.  No recent falls or injuries.  It is a low-grade continual pain that hurts more if he sits and then stands up.  Tylenol does help somewhat with this.  PET scan earlier this year did not report any findings in his hips.  Lung cancer: Patient recently finished radiation.  Notes he tolerated this well.  No hemoptysis.  He is following up with radiation oncology in the near future.   ROS: See pertinent positives and negatives per HPI.  Past Medical History:  Diagnosis Date  . Atrial fibrillation ([place])   . AV block    s/p Pacemaker placement.  . Cancer ([place]) 11/2008   being treated with radiation for lung cancer  . Chicken pox   . History of blood transfusion    patient and wife do not recall ever receiving a blood transfusion  . Hyperlipidemia   . Hypertension   . Presence of permanent cardiac pacemaker     Past Surgical History:  Procedure Laterality Date  . EYE SURGERY Bilateral    cataract extractions with iol  . HERNIA REPAIR    . KNEE ARTHROSCOPY Left 2016  . KYPHOPLASTY N/A 12/20/2018   Procedure: KYPHOPLASTY L1, L2, L5;  Surgeon: Hessie Knows, MD;  Location: [facility] ORS;  Service: Orthopedics;  Laterality: N/A;  . PACEMAKER PLACEMENT  2010   sss, AF  . PROSTATECTOMY  2008    Family History  Problem Relation Age of Onset  . Cancer Father   . Heart disease Father   . Cancer Brother     SOCIAL HX: Smoker   Current Outpatient Medications:  .  Acetaminophen (TYLENOL PO), Take by mouth. As needed for pain, Disp: , Rfl:  .  apixaban (ELIQUIS) 2.5 MG TABS tablet, Take 2.5 mg by mouth 2 (two) times daily. , Disp: , Rfl:  .  ibuprofen (ADVIL) 100 MG/5ML suspension, Take 200 mg by mouth. As needed for pain, Disp: , Rfl:  .  Multiple Vitamins-Minerals  (PRESERVISION AREDS 2) CAPS, Take 1 tablet by mouth 2 (two) times daily., Disp: , Rfl:  .  traMADol (ULTRAM) 50 MG tablet, Take 1 tablet (50 mg total) by mouth every 8 (eight) hours as needed for moderate pain., Disp: 60 tablet, Rfl: 0 .  HYDROcodone-acetaminophen (NORCO) 5-325 MG tablet, Take 1 tablet by mouth every 6 (six) hours as needed for moderate pain. (Patient not taking: Reported on 04/09/2019), Disp: 15 tablet, Rfl: 0 .  HYDROcodone-acetaminophen (NORCO/VICODIN) 5-325 MG tablet, Take 1 tablet by mouth every 6 (six) hours as needed for moderate pain or severe pain. (Patient not taking: Reported on 03/11/2019), Disp: 45 tablet, Rfl: 0 .  naloxone (NARCAN) nasal spray 4 mg/0.1 mL, 1 spray into nostril upon signs of opioid overdose (unresponsiveness). Call 911. May repeat once if no response within 2-3 minutes. (Patient not taking: Reported on 12/10/2018), Disp: 1 kit, Rfl: 0  EXAM: This was a telehealth telephone visit and thus no physical exam was completed.   ASSESSMENT AND PLAN:  Discussed the following assessment and plan:  Bilateral hip pain I suspect this is related to osteoarthritis or could be related to his compression fractures in his back.  Prior x-rays with minimal degenerative change.  Repeat x-rays.  Compression fracture of lumbar vertebra ([place]) I did encourage the patient to consider follow-up with orthopedics.  We will prescribe tramadol to use at night for pain.  He will monitor for drowsiness with this.  Osteoporosis Discussed treating with Fosamax.  We will have him come in for a vitamin D and calcium check then determine start date for Fosamax.  I did discuss that it is very important that he follows the directions for taking the Fosamax as there is some risk of esophageal and gastric irritation and ulcer.  Non-small cell lung cancer [facility]) Patient has completed radiation.  He will continue to follow with radiation oncology.  [place] office staff will contact the  patient to schedule follow-up and lab work.  Social distancing precautions and sick precautions given regarding COVID-19.   I discussed the assessment and treatment plan with the patient. The patient was provided an opportunity to ask questions and all were answered. The patient agreed with the plan and demonstrated an understanding of the instructions.   The patient was advised to call back or seek an in-person evaluation if the symptoms worsen or if the condition fails to improve as anticipated.  I provided 21 minutes of non-face-to-face time during this encounter.   Tommi Rumps, MD

## 2019-04-09 NOTE — Assessment & Plan Note (Signed)
Patient has completed radiation.  He will continue to follow with radiation oncology.

## 2019-04-22 ENCOUNTER — Telehealth: Payer: Self-pay

## 2019-04-22 NOTE — Telephone Encounter (Signed)
Copied from [place] [id number]. Topic: Appointment Scheduling - Scheduling Inquiry for Clinic >> Apr 19, 2019  4:11 PM Erick Blinks wrote: Reason for CRM: Pt requesting call back to reschedule  Best Contact: [phone]

## 2019-04-24 ENCOUNTER — Other Ambulatory Visit: Payer: Medicare Other

## 2019-05-01 ENCOUNTER — Other Ambulatory Visit: Payer: Self-pay

## 2019-05-01 ENCOUNTER — Other Ambulatory Visit: Payer: Medicare Other

## 2019-05-01 ENCOUNTER — Other Ambulatory Visit (INDEPENDENT_AMBULATORY_CARE_PROVIDER_SITE_OTHER): Payer: Medicare Other

## 2019-05-01 DIAGNOSIS — S32000D Wedge compression fracture of unspecified lumbar vertebra, subsequent encounter for fracture with routine healing: Secondary | ICD-10-CM

## 2019-05-01 LAB — COMPREHENSIVE METABOLIC PANEL
ALT: 10 U/L (ref 0–53)
AST: 14 U/L (ref 0–37)
Albumin: 3.8 g/dL (ref 3.5–5.2)
Alkaline Phosphatase: 106 U/L (ref 39–117)
BUN: 15 mg/dL (ref 6–23)
CO2: 25 mEq/L (ref 19–32)
Calcium: 9 mg/dL (ref 8.4–10.5)
Chloride: 101 mEq/L (ref 96–112)
Creatinine, Ser: 0.68 mg/dL (ref 0.40–1.50)
GFR: 110.22 mL/min (ref >60.00)
Glucose, Bld: 84 mg/dL (ref 70–99)
Potassium: 4.8 mEq/L (ref 3.5–5.1)
Sodium: 134 mEq/L — ABNORMAL LOW (ref 135–145)
Total Bilirubin: 0.8 mg/dL (ref 0.2–1.2)
Total Protein: 6.6 g/dL (ref 6.0–8.3)

## 2019-05-01 LAB — VITAMIN D 25 HYDROXY (VIT D DEFICIENCY, FRACTURES): VITD: 15.28 ng/mL — ABNORMAL LOW (ref 30.00–100.00)

## 2019-05-01 IMAGING — CT CT CHEST W/O CM
2 of 3 series · 15 of 36 positions shown, 18 images · non-contrast
Comparison: 09/19/2018 abdominal CT.  Prior radiographs

CLINICAL DATA: 88-year-old male with RIGHT LOWER lobe opacity
identified on recent abdomen CT. History of chronic interstitial
lung disease/fibrosis.

EXAM:
CT CHEST WITHOUT CONTRAST
TECHNIQUE: Multidetector CT imaging of the chest was performed following the
standard protocol without IV contrast.

[Series 2: thorax · axial · 0.81mm/px · z∈[-636,-348]mm · 12 of 170 slices shown, 15 images]
[im 13/170  mediastinal]
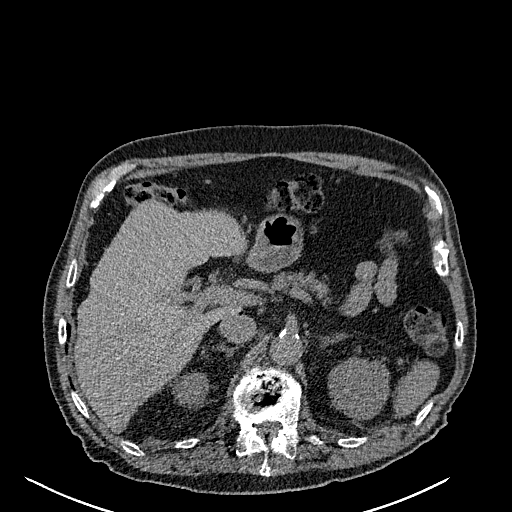
[im 13/170  lung]
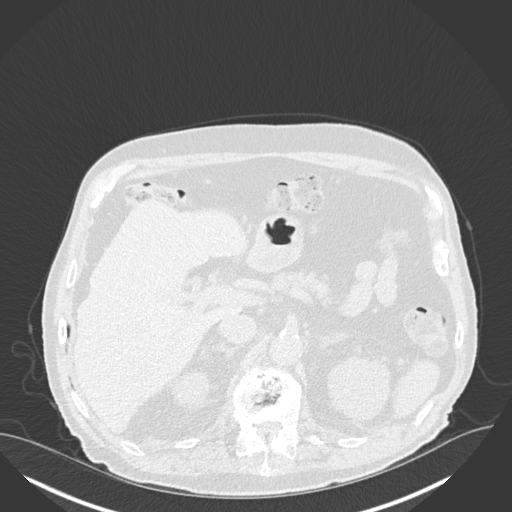
[im 26/170  lung]
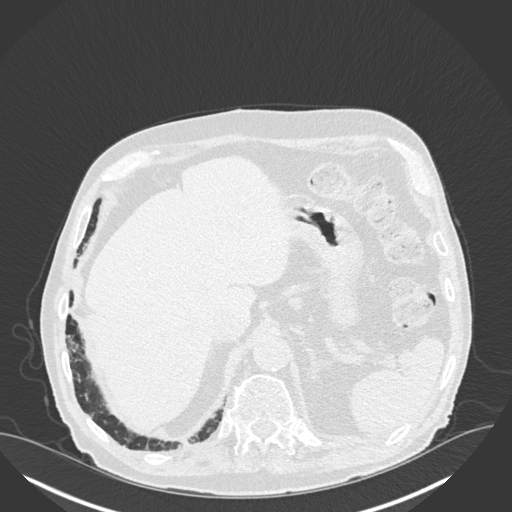
[im 38/170  lung]
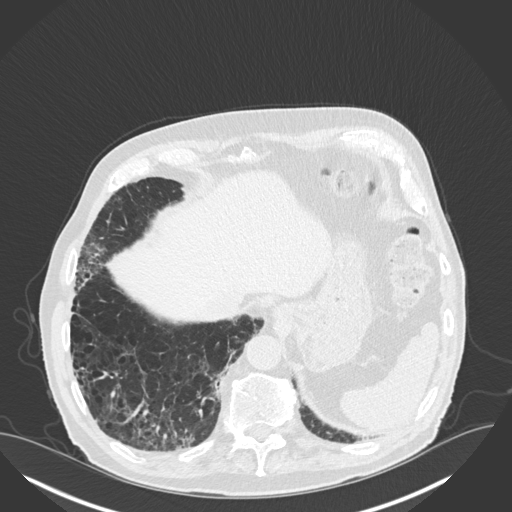
[im 51/170  lung]
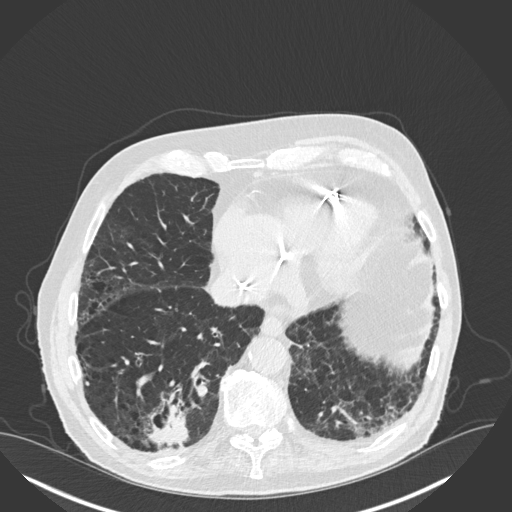
[im 63/170  mediastinal]
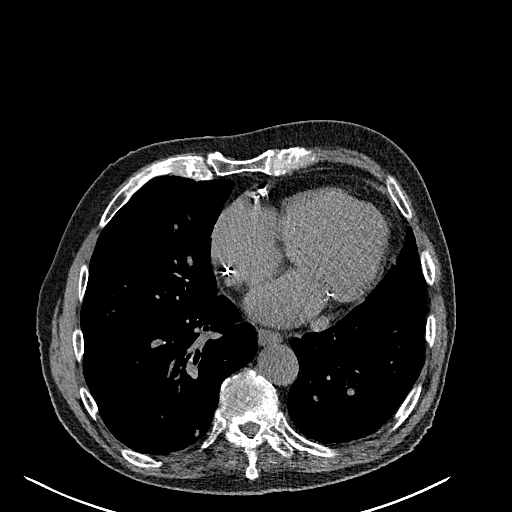
[im 63/170  lung]
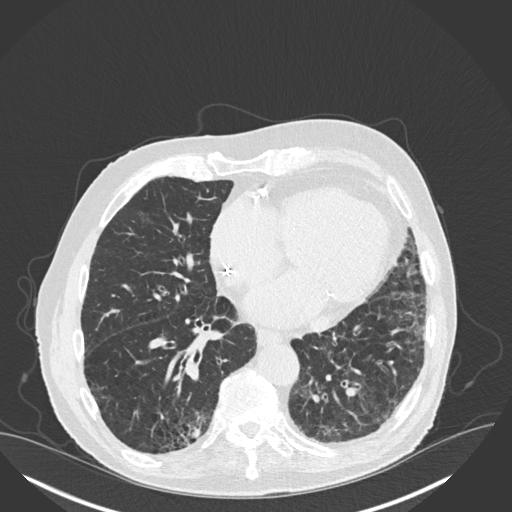
[im 76/170  lung]
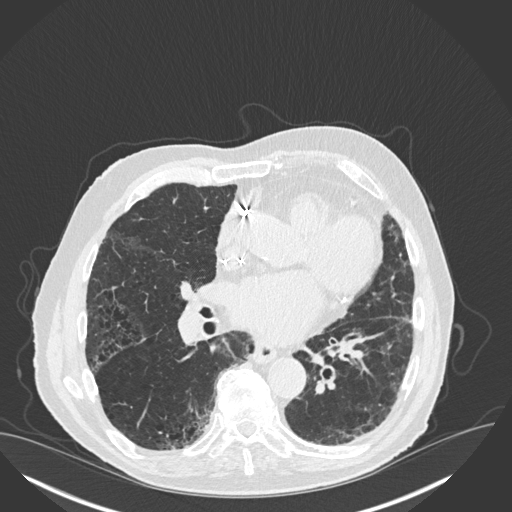
[im 94/170  lung]
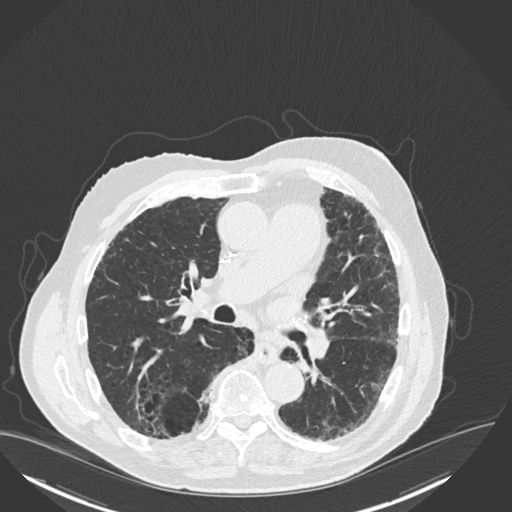
[im 107/170  lung]
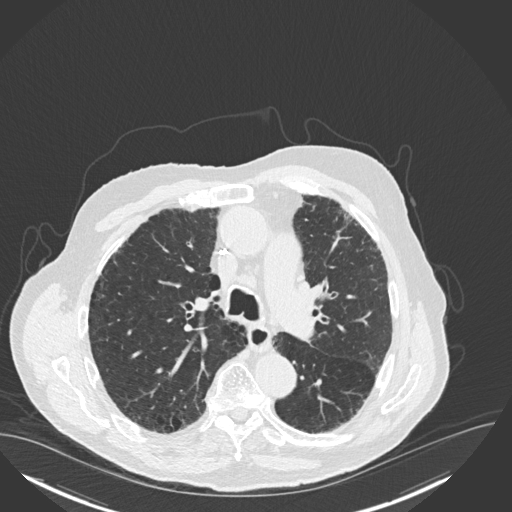
[im 119/170  mediastinal]
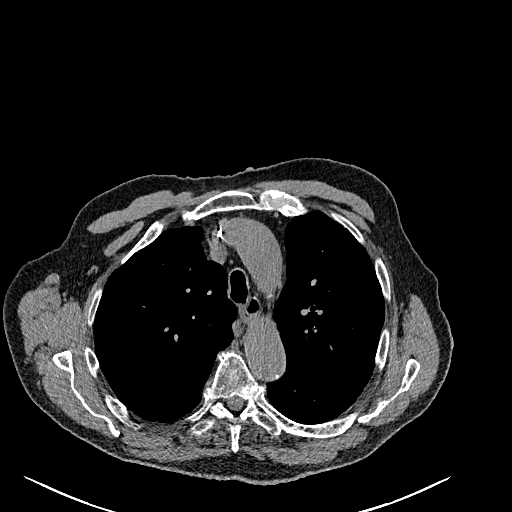
[im 119/170  lung]
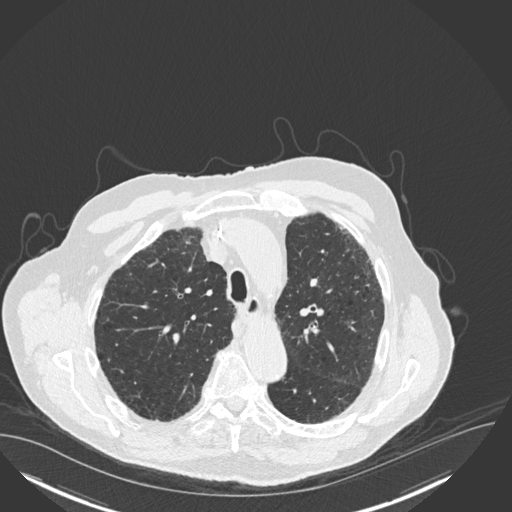
[im 132/170  lung]
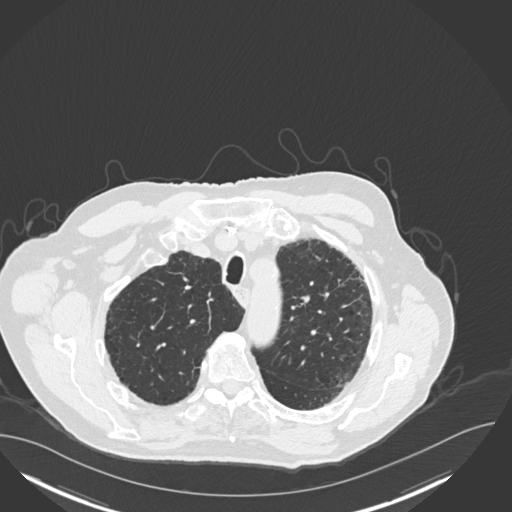
[im 144/170  lung]
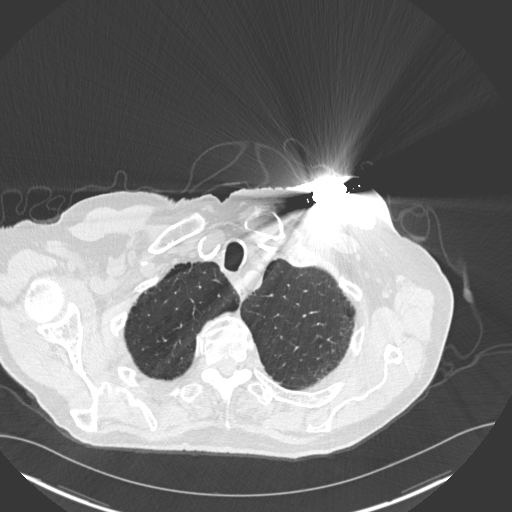
[im 157/170  lung]
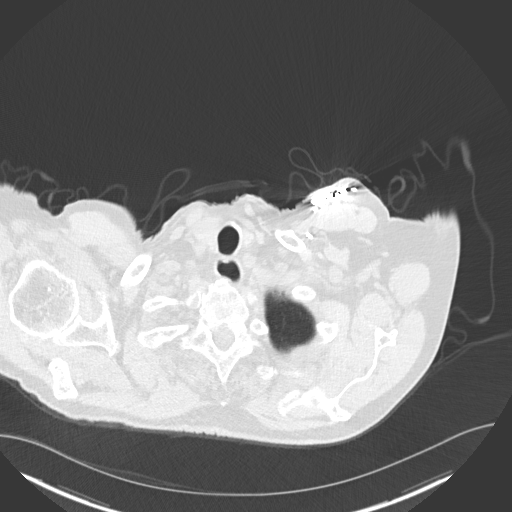

[Series 5: coronal · coronal · 0.77mm/px · 3 of 159 slices shown]
[im 32/159  lung]
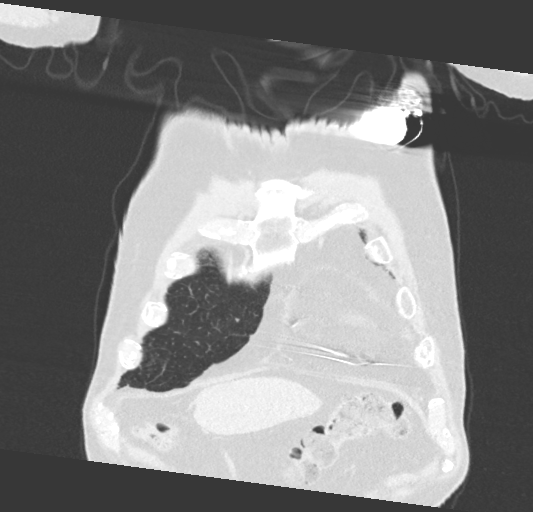
[im 64/159  lung]
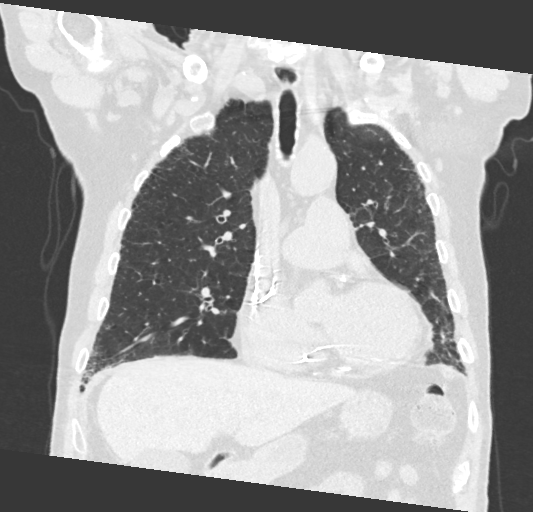
[im 95/159  lung]
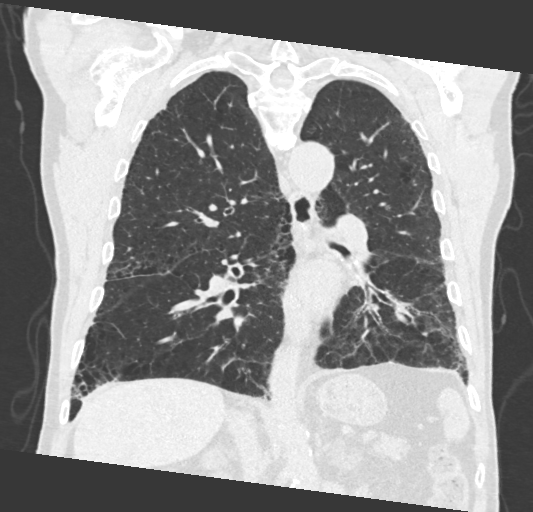

[15 of 36 positions shown; findings below may reference images not displayed]

FINDINGS: Cardiovascular: Cardiomegaly and heavy coronary artery
atherosclerotic calcifications are identified. A LEFT-sided
pacemaker is again noted. Aortic atherosclerotic calcifications
identified without aneurysm. No pericardial effusion.

Mediastinum/Nodes: Mildly enlarged lymph nodes are as follows:

A 1.5 cm precarinal node (series 2: Image 66)

A 1 cm RIGHT hilar node ([DATE])

A 1.1 cm subcarinal node ([DATE]).

No mediastinal masses are identified. The visualized thyroid and
esophagus are unremarkable.

Lungs/Pleura: A 2.2 x 4 x 2.4 cm irregular mass within the posterior
RIGHT LOWER lobe is worrisome for malignancy.

Small (3 mm or less) nodules are identified, primarily within the
LEFT UPPER lobe (series 3, images 46, 49, 60, 68, and 71).

Mild centrilobular emphysema is noted. Peripheral interlobular
septal thickening with some honeycombing identified.

No pleural effusion or pneumothorax.

Upper Abdomen: No acute abnormality. No enlarged UPPER abdominal
lymph nodes noted.

Musculoskeletal: A 50% 6 L1 compression fracture is again
identified. No suspicious focal bony abnormalities are identified.
IMPRESSION: 1. 2.4 cm RIGHT LOWER lobe mass worrisome for primary malignancy.
Indeterminate mildly enlarged mediastinal and RIGHT hilar lymph
nodes.
2. Small pulmonary nodules, primarily within the LEFT UPPER lobe,
indeterminate. Consider attention to these nodules on future CTs.
3. Interstitial lung disease/fibrosis.
4. Cardiomegaly and coronary artery disease.
5. 50% L1 compression fracture again noted.
6. Aortic Atherosclerosis (1UMG1-KNG.G) and Emphysema (1UMG1-OX9.S).

## 2019-05-02 ENCOUNTER — Other Ambulatory Visit: Payer: Self-pay | Admitting: Family Medicine

## 2019-05-02 DIAGNOSIS — E559 Vitamin D deficiency, unspecified: Secondary | ICD-10-CM

## 2019-05-02 MED ORDER — VITAMIN D (ERGOCALCIFEROL) 1.25 MG (50000 UNIT) PO CAPS: 50000.0000 [IU] | ORAL_CAPSULE | ORAL | 0 refills | Status: DC

## 2019-05-02 NOTE — Progress Notes (Signed)
ergo 

## 2019-05-16 ENCOUNTER — Ambulatory Visit (INDEPENDENT_AMBULATORY_CARE_PROVIDER_SITE_OTHER): Payer: Medicare Other

## 2019-05-16 DIAGNOSIS — M25551 Pain in right hip: Secondary | ICD-10-CM | POA: Diagnosis not present

## 2019-05-16 DIAGNOSIS — M25552 Pain in left hip: Secondary | ICD-10-CM | POA: Diagnosis not present

## 2019-05-16 DIAGNOSIS — Z0000 Encounter for general adult medical examination without abnormal findings: Secondary | ICD-10-CM

## 2019-05-16 NOTE — Patient Instructions (Addendum)
  Gary [name] , Thank you for taking time to come for your Medicare Wellness Visit. I appreciate your ongoing commitment to your health goals. Please review the following plan we discussed and let me know if I can assist you in the future.   These are the goals we discussed: Goals    . Healthy Lifestyle     Stay active and drink plenty of fluids/water. Low carb foods.  Lean meats and vegetables. Stay active.  Chair/sitting exercises.  Walking for exercise.       This is a list of the screening recommended for you and due dates:  Health Maintenance  Topic Date Due  . Flu Shot  05/25/2019  . Tetanus Vaccine  07/22/2025  . Pneumonia vaccines  Completed

## 2019-05-16 NOTE — Progress Notes (Signed)
Subjective:   Gary [name] is a 85 y.o. male who presents for Medicare Annual/Subsequent preventive examination.  Review of Systems:  No ROS.  Medicare Wellness Virtual Visit.  Visual/audio telehealth visit, UTA vital signs.   See social history for additional risk factors.   Cardiac Risk Factors include: advanced age (>71men, >67 women);male gender;hypertension;smoking/ tobacco exposure     Objective:    Vitals: There were no vitals taken for this visit.  There is no height or weight on file to calculate BMI.  Advanced Directives 05/16/2019 03/11/2019 12/06/2018 12/03/2018 11/15/2018 10/26/2018 06/21/2018  Does Patient Have a Medical Advance Directive? Yes No Yes Yes No Yes Yes  Type of Paramedic of [place];Living will - [place];Living will Living will;Healthcare Power of [place];Living will [place]  Does patient want to make changes to medical advance directive? No - Patient declined - No - Patient declined No - Patient declined No - Patient declined No - Patient declined No - Patient declined  Copy of [place] in Chart? No - copy requested - No - copy requested No - copy requested - No - copy requested No - copy requested  Would patient like information on creating a medical advance directive? - No - Patient declined - - - - No - Patient declined    Tobacco Social History   Tobacco Use  Smoking Status Current Some Day Smoker  . Packs/day: 0.25  . Years: 40.00  . Pack years: 10.00  . Types: Cigarettes  Smokeless Tobacco Never Used  Tobacco Comment   trying to quit before surgery. smokes 6-7 a day     Ready to quit: Not Answered Counseling given: Not Answered Comment: trying to quit before surgery. smokes 6-7 a day   Clinical Intake:  Pre-visit preparation completed: Yes        Diabetes: No  How often do you need to have someone help you when you  read instructions, pamphlets, or other written materials from your doctor or pharmacy?: 4 - Often  Interpreter Needed?: No     Past Medical History:  Diagnosis Date  . Atrial fibrillation ([place])   . AV block    s/p Pacemaker placement.  . Cancer ([place]) 11/2008   being treated with radiation for lung cancer  . Chicken pox   . History of blood transfusion    patient and wife do not recall ever receiving a blood transfusion  . Hyperlipidemia   . Hypertension   . Presence of permanent cardiac pacemaker    Past Surgical History:  Procedure Laterality Date  . EYE SURGERY Bilateral    cataract extractions with iol  . HERNIA REPAIR    . KNEE ARTHROSCOPY Left 2016  . KYPHOPLASTY N/A 12/20/2018   Procedure: KYPHOPLASTY L1, L2, L5;  Surgeon: Hessie Knows, MD;  Location: [facility] ORS;  Service: Orthopedics;  Laterality: N/A;  . PACEMAKER PLACEMENT  2010   sss, AF  . PROSTATECTOMY  2008   Family History  Problem Relation Age of Onset  . Cancer Father   . Heart disease Father   . Cancer Brother    Social History   Socioeconomic History  . Marital status: Married    Spouse name: Pamala Hurry  . Number of children: Not on file  . Years of education: Not on file  . Highest education level: Not on file  Occupational History  . 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 Financial resource strain: Not hard at all  . Food insecurity    Worry: Never true    Inability: Never true  . Transportation needs    Medical: No    Non-medical: No  Tobacco Use  . Smoking status: Current Some Day Smoker    Packs/day: 0.25    Years: 40.00    Pack years: 10.00    Types: Cigarettes  . Smokeless tobacco: Never Used  . Tobacco comment: trying to quit before surgery. smokes 6-7 a day  Substance and Sexual Activity  . Alcohol use: Never    Alcohol/week: 0.0 standard drinks    Frequency: Never  . Drug use: No  . Sexual activity: Not Currently  Lifestyle  . Physical activity     Days per week: 0 days    Minutes per session: Not on file  . Stress: Not on file  Relationships  . Social Herbalist on phone: Not on file    Gets together: Not on file    Attends religious service: Not on file    Active member of club or organization: Not on file    Attends meetings of clubs or organizations: Not on file    Relationship status: Not on file  Other Topics Concern  . Not on file  Social History Narrative   qiut smoking dc 2019; smoking 1/2 ppd x 15-20 years; 3drinks/liquior a week; gies with walker at home;     Outpatient Encounter Medications as of 05/16/2019  Medication Sig  . Acetaminophen (TYLENOL PO) Take 500 mg by mouth. As needed for pain every 6 hours  . apixaban (ELIQUIS) 2.5 MG TABS tablet Take 2.5 mg by mouth 2 (two) times daily.   Marland Kitchen ibuprofen (ADVIL) 100 MG/5ML suspension Take 200 mg by mouth. As needed for pain  . Multiple Vitamins-Minerals (PRESERVISION AREDS 2) CAPS Take 1 tablet by mouth 2 (two) times daily.  . naloxone (NARCAN) nasal spray 4 mg/0.1 mL 1 spray into nostril upon signs of opioid overdose (unresponsiveness). Call 911. May repeat once if no response within 2-3 minutes.  . traMADol (ULTRAM) 50 MG tablet Take 1 tablet (50 mg total) by mouth every 8 (eight) hours as needed for moderate pain.  . Vitamin D, Ergocalciferol, (DRISDOL) 1.25 MG (50000 UT) CAPS capsule Take 1 capsule (50,000 Units total) by mouth every 7 (seven) days.  . [DISCONTINUED] HYDROcodone-acetaminophen (NORCO) 5-325 MG tablet Take 1 tablet by mouth every 6 (six) hours as needed for moderate pain.  . [DISCONTINUED] HYDROcodone-acetaminophen (NORCO/VICODIN) 5-325 MG tablet Take 1 tablet by mouth every 6 (six) hours as needed for moderate pain or severe pain.   No facility-administered encounter medications on file as of 05/16/2019.     Activities of Daily Living In your present state of health, do you have any difficulty performing the following activities:  05/16/2019 12/06/2018  Hearing? Tempie Donning  Vision? N N  Difficulty concentrating or making decisions? Tempie Donning  Walking or climbing stairs? Y Y  Comment Unsteady gait. Walker in use. -  Dressing or bathing? N Y  Comment - does better in the morning and needs help in the evening  Doing errands, shopping? N Y  Conservation officer, nature and eating ? Y -  Comment He does not cook. Meals are sanwiches, cereal or prepared by microwave, family, friends or restaraunt. -  Using the Toilet? N -  In the past six months, have you accidently leaked urine? N -  Do you have problems with loss of bowel control? N -  Managing your Medications? Y -  Comment Son assists -  Managing your Finances? Y -  Comment Son assists -  Housekeeping or managing your Housekeeping? Y -  Comment Son assists -  Some recent data might be hidden    Patient Care Team: Leone Haven, MD as PCP - General (Family Medicine)   Assessment:   This is a routine wellness examination for Jyden.  I connected with patient 05/16/19 at 12:00 PM EDT by a video/audio enabled telemedicine application and verified that I am speaking with the correct person using two identifiers. Patient stated full name and DOB. Patient gave permission to continue with virtual visit. Patient's location was at home and Nurse's location was at [place] office. Information was primarily received from wife (HIPAA compliant).   Interactive audio and video telecommunications were attempted between this provider and patient, however failed, due to patient having technical difficulties OR patient did not have access to video capability. We continued and completed visit with audio only.  Patient requests to add 1 additional tylenol for pain or refill tramadol.  Currently takes 1 tylenol in the morning, midday and bedtime. Pain rests between 5-8 on the pain scale of 1/10. Deferred to pcp for follow up.   Health Screenings  Colonoscopy - 01/2014 Bone Density - 03/2019 Hearing  -difficulty hearing some conversational tones. Audiology deferred in the last year per patient preference. He chooses not to wear hearing aids.  Labs followed by pcp Dental- dentures Vision- wears glasses  Social  Alcohol intake - no  Smoking history- current Smokers in home? self Illicit drug use? none Exercise - no routine Diet - regular Sexually Active -not currently BMI- discussed the importance of a healthy diet, water intake and the benefits of aerobic exercise.  Educational material provided.   Safety  Patient feels safe at home- yes Patient does have smoke detectors at home- yes Patient does wear sunscreen or protective clothing when in direct sunlight -yes Patient does wear seat belt when in a moving vehicle -yes Patient drives- rarely  [id number] precautions and sickness symptoms discussed.   Activities of Daily Living Patient denies needing assistance with: feeding themselves, getting from bed to chair, getting to the toilet, bathing/showering, dressing.  Wife manages the household chores.  Son assists with money management and meals as needed.     Depression Screen Patient denies losing interest in daily life, feeling hopeless, or crying easily over simple problems.   Medication-taking as directed and without issues. Managed by his wife.   Fall Screen Patient has no falls since last reported to his primary care provider. Ambulates with walker.   Memory Screen Patient is alert.  Incorrectly identified the day of the week, month and year.  Correctly identified the president.   Immunizations The following Immunizations were discussed: Influenza, shingles, pneumonia, and tetanus.   Other Providers Patient Care Team: Leone Haven, MD as PCP - General (Family Medicine)  Exercise Activities and Dietary recommendations Current Exercise Habits: The patient does not participate in regular exercise at present  Goals    . Healthy Lifestyle     Stay active and  drink plenty of fluids/water. Low carb foods.  Lean meats and vegetables. Stay active.  Chair/sitting exercises.  Walking for exercise.       Fall Risk Fall Risk  11/02/2018 09/18/2018 04/17/2017 03/18/2016 05/08/2015  Falls in the past year? 0 1 No Yes No  Number  falls in past yr: - 1 - 1 -  Injury with Fall? - 1 - - -  Follow up - - - Falls prevention discussed;Education provided -  Is the patient's home free of loose throw rugs in walkways, pet beds, electrical cords, etc? yes      Grab bars in the bathroom? yes      Handrails on the stairs? yes      Adequate lighting? yes  Depression Screen PHQ 2/9 Scores 05/16/2019 09/18/2018 07/16/2018 04/17/2017  PHQ - 2 Score 0 4 0 0  PHQ- 9 Score - - - 0    Cognitive Function MMSE - Mini Mental State Exam 04/17/2017 03/18/2016  Orientation to time 5 5  Orientation to Place 5 5  Registration 3 3  Attention/ Calculation 5 5  Recall 3 1  Language- name 2 objects 2 2  Language- repeat 1 1  Language- follow 3 step command 3 3  Language- read & follow direction 1 1  Write a sentence 1 1  Copy design 1 1  Total score 30 28        Immunization History  Administered Date(s) Administered  . Influenza, High Dose Seasonal PF 08/10/2018  . Influenza-Unspecified 07/17/2012  . Pneumococcal Conjugate-13 07/23/2015  . Pneumococcal Polysaccharide-23 04/17/2017  . Tdap 07/23/2015   Screening Tests Health Maintenance  Topic Date Due  . INFLUENZA VACCINE  05/25/2019  . TETANUS/TDAP  07/22/2025  . PNA vac Low Risk Adult  Completed       Plan:    End of life planning; Advance aging; Advanced directives discussed.  Copy of current HCPOA/Living Will requested.    I have personally reviewed and noted the following in the patient's chart:   . Medical and social history . Use of alcohol, tobacco or illicit drugs  . Current medications and supplements . Functional ability and status . Nutritional status . Physical activity . Advanced  directives . List of other physicians . Hospitalizations, surgeries, and ER visits in previous 12 months . Vitals . Screenings to include cognitive, depression, and falls . Referrals and appointments  In addition, I have reviewed and discussed with patient certain preventive protocols, quality metrics, and best practice recommendations. A written personalized care plan for preventive services as well as general preventive health recommendations were provided to patient.     Varney Biles, LPN  8/92/1194

## 2019-05-26 MED ORDER — TRAMADOL HCL 50 MG PO TABS: 50.0000 mg | ORAL_TABLET | Freq: Three times a day (TID) | ORAL | 0 refills | Status: DC | PRN

## 2019-05-26 NOTE — Addendum Note (Signed)
Addended by: Leone Haven on: 05/26/2019 01:15 PM   Modules accepted: Orders

## 2019-06-20 ENCOUNTER — Other Ambulatory Visit: Payer: Self-pay | Admitting: Orthopedic Surgery

## 2019-06-20 DIAGNOSIS — M545 Low back pain, unspecified: Secondary | ICD-10-CM

## 2019-06-25 ENCOUNTER — Encounter
Admission: RE | Admit: 2019-06-25 | Discharge: 2019-06-25 | Disposition: A | Discharge status: Home / Self Care | Payer: Medicare Other | Source: Ambulatory Visit | Attending: Orthopedic Surgery | Admitting: Orthopedic Surgery

## 2019-06-25 ENCOUNTER — Other Ambulatory Visit: Payer: Self-pay

## 2019-06-25 DIAGNOSIS — M545 Low back pain, unspecified: Secondary | ICD-10-CM

## 2019-06-25 MED ORDER — TECHNETIUM TC 99M MEDRONATE IV KIT
23.4540 | PACK | Freq: Once | INTRAVENOUS | Status: AC | PRN
  Administered 2019-06-25: 23.454 via INTRAVENOUS

## 2019-07-02 ENCOUNTER — Telehealth: Payer: Self-pay

## 2019-07-02 ENCOUNTER — Ambulatory Visit: Payer: Medicare Other

## 2019-07-02 NOTE — Telephone Encounter (Signed)
Copied from [place] [phone]. Topic: Appointment Scheduling - Scheduling Inquiry for Clinic >> Jul 02, 2019  1:39 PM Rainey Pines A wrote: Patients wife called and requested patient have flu shot tomorrow during his lab appointment.

## 2019-07-02 NOTE — Telephone Encounter (Signed)
Scheduled

## 2019-07-03 ENCOUNTER — Other Ambulatory Visit: Payer: Self-pay

## 2019-07-03 ENCOUNTER — Ambulatory Visit: Payer: Medicare Other

## 2019-07-03 ENCOUNTER — Other Ambulatory Visit (INDEPENDENT_AMBULATORY_CARE_PROVIDER_SITE_OTHER): Payer: Medicare Other

## 2019-07-03 DIAGNOSIS — Z23 Encounter for immunization: Secondary | ICD-10-CM

## 2019-07-03 DIAGNOSIS — E559 Vitamin D deficiency, unspecified: Secondary | ICD-10-CM | POA: Diagnosis not present

## 2019-07-03 LAB — VITAMIN D 25 HYDROXY (VIT D DEFICIENCY, FRACTURES): VITD: 29.21 ng/mL — ABNORMAL LOW (ref 30.00–100.00)

## 2019-07-10 ENCOUNTER — Other Ambulatory Visit: Payer: Self-pay

## 2019-07-10 ENCOUNTER — Ambulatory Visit
Admission: RE | Admit: 2019-07-10 | Discharge: 2019-07-10 | Disposition: A | Discharge status: Home / Self Care | Payer: Medicare Other | Source: Ambulatory Visit | Attending: Radiation Oncology | Admitting: Radiation Oncology

## 2019-07-10 ENCOUNTER — Telehealth: Payer: Self-pay

## 2019-07-10 DIAGNOSIS — R918 Other nonspecific abnormal finding of lung field: Secondary | ICD-10-CM | POA: Insufficient documentation

## 2019-07-10 DIAGNOSIS — E559 Vitamin D deficiency, unspecified: Secondary | ICD-10-CM

## 2019-07-10 DIAGNOSIS — M8000XD Age-related osteoporosis with current pathological fracture, unspecified site, subsequent encounter for fracture with routine healing: Secondary | ICD-10-CM

## 2019-07-10 LAB — POCT I-STAT CREATININE: Creatinine, Ser: 0.8 mg/dL (ref 0.61–1.24)

## 2019-07-10 MED ORDER — IOHEXOL 300 MG/ML  SOLN
75.0000 mL | Freq: Once | INTRAMUSCULAR | Status: AC | PRN
  Administered 2019-07-10: 75 mL via INTRAVENOUS

## 2019-07-10 NOTE — Telephone Encounter (Signed)
Copied from [place] [id number]. Topic: General - Other >> Jul 10, 2019  8:23 AM Rayann Heman wrote: Reason for CRM: pt wife called and stated that she thought that an Rx was to be sent over to the pharmacy per lab notes. Pt wife would like a call back regarding.

## 2019-07-10 NOTE — Telephone Encounter (Signed)
Please send in the fosamax you stated for the patient after lab results.  Eithan Beagle,cma

## 2019-07-11 MED ORDER — ALENDRONATE SODIUM 70 MG PO TABS: 70.0000 mg | ORAL_TABLET | ORAL | 11 refills | Status: AC

## 2019-07-11 NOTE — Telephone Encounter (Signed)
Fosamax sent to pharmacy.  Order for vitamin D placed.

## 2019-07-12 ENCOUNTER — Other Ambulatory Visit: Payer: Self-pay

## 2019-07-15 ENCOUNTER — Other Ambulatory Visit: Payer: Self-pay

## 2019-07-15 ENCOUNTER — Other Ambulatory Visit: Payer: Self-pay | Admitting: *Deleted

## 2019-07-15 ENCOUNTER — Encounter: Payer: Self-pay | Admitting: Radiation Oncology

## 2019-07-15 ENCOUNTER — Ambulatory Visit
Admission: RE | Admit: 2019-07-15 | Discharge: 2019-07-15 | Disposition: A | Discharge status: Home / Self Care | Payer: Medicare Other | Source: Ambulatory Visit | Attending: Radiation Oncology | Admitting: Radiation Oncology

## 2019-07-15 VITALS — BP 109/88 | HR 66 | Temp 95.9°F | Resp 18

## 2019-07-15 DIAGNOSIS — R911 Solitary pulmonary nodule: Secondary | ICD-10-CM | POA: Diagnosis not present

## 2019-07-15 DIAGNOSIS — C3431 Malignant neoplasm of lower lobe, right bronchus or lung: Secondary | ICD-10-CM | POA: Diagnosis not present

## 2019-07-15 DIAGNOSIS — R918 Other nonspecific abnormal finding of lung field: Secondary | ICD-10-CM

## 2019-07-15 DIAGNOSIS — Z923 Personal history of irradiation: Secondary | ICD-10-CM | POA: Insufficient documentation

## 2019-07-15 NOTE — Progress Notes (Signed)
Radiation Oncology Follow up Note  Name: Gary [name]   Date:   07/15/2019 MRN:  [id number] DOB: 1929-12-20    This 85 y.o. male presents to the clinic today for 62-month follow-up status post radiation therapy to the right lower lobe for stage Ib non-small cell lung cancer.  REFERRING PROVIDER: Leone Haven, MD  HPI: Patient is an 85 year old male now out 5 months having completed radiation therapy to his right lower lobe for stage Ib non-small cell lung cancer.  He is seen today in routine follow-up and is doing well.  He specifically denies cough hemoptysis or chest tightness.  He had a recent CT scan.  Of his chest showing new right lower lobe consolidation which is compatible with radiation changes.  This obscures his previous right lower lobe dominant nodule.  His thoracic nodes are unchanged there is a small right upper lobe subpleural nodule which warrants follow-up attention.  COMPLICATIONS OF TREATMENT: none  FOLLOW UP COMPLIANCE: keeps appointments   PHYSICAL EXAM:  BP 109/88   Pulse 66   Temp (!) 95.9 F (35.5 C)   Resp 68  Elderly male wheelchair-bound in NAD.  Well-developed well-nourished patient in NAD. HEENT reveals PERLA, EOMI, discs not visualized.  Oral cavity is clear. No oral mucosal lesions are identified. Neck is clear without evidence of cervical or supraclavicular adenopathy. Lungs are clear to A&P. Cardiac examination is essentially unremarkable with regular rate and rhythm without murmur rub or thrill. Abdomen is benign with no organomegaly or masses noted. Motor sensory and DTR levels are equal and symmetric in the upper and lower extremities. Cranial nerves II through XII are grossly intact. Proprioception is intact. No peripheral adenopathy or edema is identified. No motor or sensory levels are noted. Crude visual fields are within normal range.  RADIOLOGY RESULTS: CT scans reviewed compatible with above-stated findings  PLAN: Present time patient is  doing well with excellent response by CT criteria.  I am pleased with his overall progress.  I have asked to see him back in 6 months for follow-up with a CT scan prior to that visit.  Patient knows to call sooner with any concerns.  I would like to take this opportunity to thank you for allowing me to participate in the care of your patient.Noreene Filbert, MD

## 2019-08-08 ENCOUNTER — Other Ambulatory Visit (INDEPENDENT_AMBULATORY_CARE_PROVIDER_SITE_OTHER): Payer: Medicare Other

## 2019-08-08 ENCOUNTER — Other Ambulatory Visit: Payer: Self-pay

## 2019-08-08 DIAGNOSIS — E559 Vitamin D deficiency, unspecified: Secondary | ICD-10-CM | POA: Diagnosis not present

## 2019-08-08 LAB — VITAMIN D 25 HYDROXY (VIT D DEFICIENCY, FRACTURES): VITD: 38.67 ng/mL (ref 30.00–100.00)

## 2019-08-09 ENCOUNTER — Other Ambulatory Visit: Payer: Self-pay

## 2019-08-09 ENCOUNTER — Telehealth: Payer: Self-pay

## 2019-08-09 NOTE — Telephone Encounter (Signed)
Copied from [place] [phone]. Topic: General - Inquiry >> Aug 09, 2019  1:36 PM Mathis Bud wrote: Reason for CRM: patient wife called regarding patients medication doses. Call back [phone]

## 2019-08-09 NOTE — Telephone Encounter (Signed)
Updated patients medication list for vitamin d OVC.  Gary [name],cma

## 2019-08-09 NOTE — Telephone Encounter (Signed)
-----   Message from Leone Haven, MD sent at 08/08/2019  5:04 PM EDT ----- Please let the patient know that his vitamin d is now normal. Please see what dose of vitamin D over the counter he is currently taking and update it in his medications. Thanks.

## 2019-08-13 NOTE — Telephone Encounter (Signed)
Patient's wife called me back to clarify that the patient's dose of vitamin D was 5000 mg.  Tyshia Fenter,cma

## 2019-08-14 ENCOUNTER — Other Ambulatory Visit: Payer: Self-pay

## 2019-08-14 NOTE — Telephone Encounter (Signed)
Patient's wife called in and stated the dose for the vitamin d OVC is 5000 units.  This was changed in chart.  Tiffny Gemmer,cma

## 2019-08-14 NOTE — Telephone Encounter (Signed)
Noted. Can you change this in his chart? Thanks.

## 2020-01-16 ENCOUNTER — Other Ambulatory Visit: Payer: Self-pay

## 2020-01-16 ENCOUNTER — Encounter: Payer: Self-pay | Admitting: Radiation Oncology

## 2020-01-17 ENCOUNTER — Ambulatory Visit
Admission: RE | Admit: 2020-01-17 | Discharge: 2020-01-17 | Disposition: A | Discharge status: Home / Self Care | Payer: Medicare Other | Source: Ambulatory Visit | Attending: Radiation Oncology | Admitting: Radiation Oncology

## 2020-01-17 DIAGNOSIS — R918 Other nonspecific abnormal finding of lung field: Secondary | ICD-10-CM | POA: Diagnosis present

## 2020-01-17 LAB — POCT I-STAT CREATININE: Creatinine, Ser: 0.9 mg/dL (ref 0.61–1.24)

## 2020-01-17 MED ORDER — IOHEXOL 300 MG/ML  SOLN
75.0000 mL | Freq: Once | INTRAMUSCULAR | Status: AC | PRN
  Administered 2020-01-17: 75 mL via INTRAVENOUS

## 2020-01-20 ENCOUNTER — Ambulatory Visit
Admission: RE | Admit: 2020-01-20 | Discharge: 2020-01-20 | Disposition: A | Discharge status: Home / Self Care | Payer: Medicare Other | Source: Ambulatory Visit | Attending: Radiation Oncology | Admitting: Radiation Oncology

## 2020-01-20 ENCOUNTER — Other Ambulatory Visit: Payer: Self-pay | Admitting: *Deleted

## 2020-01-20 VITALS — BP 117/73 | HR 77 | Temp 94.3°F | Resp 16 | Wt 187.5 lb

## 2020-01-20 DIAGNOSIS — R918 Other nonspecific abnormal finding of lung field: Secondary | ICD-10-CM

## 2020-01-20 NOTE — Progress Notes (Signed)
Radiation Oncology Follow up Note  Name: Gary [name]   Date:   01/20/2020 MRN:  [id number] DOB: Oct 22, 1930    This 86 y.o. male presents to the clinic today for 1 year follow-up status post radiation therapy to the right lower lobe for stage Ib non-small cell lung cancer.  REFERRING PROVIDER: Leone Haven, MD  HPI: Patient is an 86 year old male now at 1 year having completed radiation therapy to his right lower lobe for stage Ib non-small cell lung cancer.  Seen today he is doing fairly well although he states he is a week.  He has a mild nonproductive cough specifically denies hemoptysis.Marland Kitchen  Unfortunately recently had a CT scan of his chest last week showing the area of the right lower lobe interval progression of architectural distortion which I attribute to radiation treatments.  There is a new previous identified posterior right upper lobe nodule measuring 2.7 cm worrisome for new site of lung cancer.  There is also an increase in size of right hilar lymph nodes worrisome for metastatic disease.  COMPLICATIONS OF TREATMENT: none  FOLLOW UP COMPLIANCE: keeps appointments   PHYSICAL EXAM:  BP 117/73   Pulse 77   Temp (!) 94.3 F (34.6 C)   Resp 16   Wt 187 lb 8 oz (85 kg)   BMI 24.40 kg/m  Elderly frail male wheelchair-bound in NAD.  Well-developed well-nourished patient in NAD. HEENT reveals PERLA, EOMI, discs not visualized.  Oral cavity is clear. No oral mucosal lesions are identified. Neck is clear without evidence of cervical or supraclavicular adenopathy. Lungs are clear to A&P. Cardiac examination is essentially unremarkable with regular rate and rhythm without murmur rub or thrill. Abdomen is benign with no organomegaly or masses noted. Motor sensory and DTR levels are equal and symmetric in the upper and lower extremities. Cranial nerves II through XII are grossly intact. Proprioception is intact. No peripheral adenopathy or edema is identified. No motor or sensory levels  are noted. Crude visual fields are within normal range.  RADIOLOGY RESULTS: CT scans reviewed compatible with above-stated findings  PLAN: Present time of ordered a PET CT scan to better delineate areas of polyps possible tumor progression both within his right lung as well as mediastinum and hilar region.  Should this be positive will refer patient to medical oncology for consideration of possible concurrent treatment depending on CT findings.  All this was explained to patient.  The both seem to comprehend my treatment went well although there may be some early cognitive impairment in both patient and wife.  I have set up a follow-up couple days after PET scan.  I would like to take this opportunity to thank you for allowing me to participate in the care of your patient.Noreene Filbert, MD

## 2020-02-03 ENCOUNTER — Other Ambulatory Visit: Payer: Self-pay

## 2020-02-03 ENCOUNTER — Encounter
Admission: RE | Admit: 2020-02-03 | Discharge: 2020-02-03 | Disposition: A | Discharge status: Home / Self Care | Payer: Medicare Other | Source: Ambulatory Visit | Attending: Radiation Oncology | Admitting: Radiation Oncology

## 2020-02-03 ENCOUNTER — Ambulatory Visit: Payer: Medicare Other

## 2020-02-03 DIAGNOSIS — J328 Other chronic sinusitis: Secondary | ICD-10-CM | POA: Insufficient documentation

## 2020-02-03 DIAGNOSIS — N200 Calculus of kidney: Secondary | ICD-10-CM | POA: Diagnosis not present

## 2020-02-03 DIAGNOSIS — Z923 Personal history of irradiation: Secondary | ICD-10-CM | POA: Insufficient documentation

## 2020-02-03 DIAGNOSIS — I2510 Atherosclerotic heart disease of native coronary artery without angina pectoris: Secondary | ICD-10-CM | POA: Diagnosis not present

## 2020-02-03 DIAGNOSIS — I517 Cardiomegaly: Secondary | ICD-10-CM | POA: Diagnosis not present

## 2020-02-03 DIAGNOSIS — J438 Other emphysema: Secondary | ICD-10-CM | POA: Diagnosis not present

## 2020-02-03 DIAGNOSIS — Z950 Presence of cardiac pacemaker: Secondary | ICD-10-CM | POA: Diagnosis not present

## 2020-02-03 DIAGNOSIS — R918 Other nonspecific abnormal finding of lung field: Secondary | ICD-10-CM | POA: Diagnosis present

## 2020-02-03 DIAGNOSIS — I700 Atherosclerosis of aorta: Secondary | ICD-10-CM | POA: Diagnosis not present

## 2020-02-03 LAB — GLUCOSE, CAPILLARY: Glucose-Capillary: 66 mg/dL — ABNORMAL LOW (ref 70–99)

## 2020-02-03 MED ORDER — FLUDEOXYGLUCOSE F - 18 (FDG) INJECTION
9.5000 | Freq: Once | INTRAVENOUS | Status: AC | PRN
  Administered 2020-02-03: 10.44 via INTRAVENOUS

## 2020-02-04 ENCOUNTER — Telehealth: Payer: Self-pay | Admitting: Family Medicine

## 2020-02-04 NOTE — Telephone Encounter (Signed)
Patient needs follow Up appointment scheduled with PCP. Please schedule.

## 2020-02-07 ENCOUNTER — Other Ambulatory Visit: Payer: Self-pay

## 2020-02-10 ENCOUNTER — Ambulatory Visit
Admission: RE | Admit: 2020-02-10 | Discharge: 2020-02-10 | Disposition: A | Discharge status: Home / Self Care | Payer: Medicare Other | Source: Ambulatory Visit | Attending: Radiation Oncology | Admitting: Radiation Oncology

## 2020-02-10 ENCOUNTER — Encounter: Payer: Self-pay | Admitting: Radiation Oncology

## 2020-02-10 ENCOUNTER — Other Ambulatory Visit: Payer: Self-pay

## 2020-02-10 VITALS — BP 104/65 | HR 63 | Temp 98.0°F | Resp 16

## 2020-02-10 DIAGNOSIS — C3431 Malignant neoplasm of lower lobe, right bronchus or lung: Secondary | ICD-10-CM | POA: Diagnosis present

## 2020-02-10 DIAGNOSIS — M549 Dorsalgia, unspecified: Secondary | ICD-10-CM | POA: Insufficient documentation

## 2020-02-10 DIAGNOSIS — R918 Other nonspecific abnormal finding of lung field: Secondary | ICD-10-CM | POA: Diagnosis not present

## 2020-02-10 DIAGNOSIS — Z923 Personal history of irradiation: Secondary | ICD-10-CM | POA: Insufficient documentation

## 2020-02-10 NOTE — Progress Notes (Signed)
Radiation Oncology Follow up Note  Name: Gary [name]   Date:   02/10/2020 MRN:  [id number] DOB: 23-Jan-1930    This 86 y.o. male presents to the clinic today for follow-up results of PET CT scan and patient status post radiation therapy to right lower lobe for stage Ib non-small cell lung cancer.  REFERRING PROVIDER: Leone Haven, MD  HPI: Patient is an 86 year old male now out over a year having completed radiation therapy to his right lower lobe for stage Ib non-small cell lung cancer.  He had a recent follow-up CT scan showing a new area in the right l upper lobe showing nodularity measuring 2.7 cm worrisome for a new site of disease.  There is also increased size of right hilar lymph nodes.  Patient experienced some moderate back pain also.  He has trouble with dysphagia at this time specifically denies cough or hemoptysis.  He had a PET CT scan showing hypermetabolic activity in the right upper lobe nodule with an SUV of 8.3 compatible with malignancy.  There was also right lower paratracheal subcarinal lymph nodes at blood pool level considered indeterminant for malignancy.  He also the right hilar lymph node with an SUV of 8.8 again compatible with malignancy.  He also had a small focus of hypermetabolic [name] in the T11 vertebral body suspicious for small area of metastatic disease.  COMPLICATIONS OF TREATMENT: none  FOLLOW UP COMPLIANCE: keeps appointments   PHYSICAL EXAM:  BP 104/65 (BP Location: Left Arm, Patient Position: Sitting, Cuff Size: Normal)   Pulse 63   Temp 98 F (36.7 C) (Tympanic)   Resp 16  Frail-appearing male in NAD wheelchair-bound.  Well-developed well-nourished patient in NAD. HEENT reveals PERLA, EOMI, discs not visualized.  Oral cavity is clear. No oral mucosal lesions are identified. Neck is clear without evidence of cervical or supraclavicular adenopathy. Lungs are clear to A&P. Cardiac examination is essentially unremarkable with regular rate and  rhythm without murmur rub or thrill. Abdomen is benign with no organomegaly or masses noted. Motor sensory and DTR levels are equal and symmetric in the upper and lower extremities. Cranial nerves II through XII are grossly intact. Proprioception is intact. No peripheral adenopathy or edema is identified. No motor or sensory levels are noted. Crude visual fields are within normal range.  RADIOLOGY RESULTS: PET CT scan reviewed compatible with above-stated findings  PLAN: At this time I referred patient back to Dr. Jacinto Reap in medical oncology.  Not sure the patient could tolerate chemotherapy at his age and frail medical status.  I would offer hypofractionated course of radiation therapy to the right upper lobe nodule as well as the right hilar adenopathy.  Also would offer palliative radiation therapy to the T11 vertebral body since his lesion does seem to abut the spinal cord.  I have put off any treatment planning until medical oncology can evaluate the patient.  Not sure if repeat biopsy with bronchoscopy would be warranted since it would not change my decision making although it may affect medical oncology's decisions.  We will discussed the case with medical oncology when he is seen.  Patient and son both comprehend my treatment plan well if put off about a week and a half for treatment planning.  I would like to take this opportunity to thank you for allowing me to participate in the care of your patient.Noreene Filbert, MD

## 2020-02-10 NOTE — Telephone Encounter (Signed)
Pt was scheduled for 5/12 but pt's wife called and cancelled appt. She said pt has so many appts right now and she will call back to reschedule.

## 2020-02-12 ENCOUNTER — Ambulatory Visit
Admission: RE | Admit: 2020-02-12 | Discharge: 2020-02-12 | Disposition: A | Discharge status: Home / Self Care | Payer: Medicare Other | Source: Ambulatory Visit | Attending: Radiation Oncology | Admitting: Radiation Oncology

## 2020-02-12 DIAGNOSIS — C3431 Malignant neoplasm of lower lobe, right bronchus or lung: Secondary | ICD-10-CM | POA: Insufficient documentation

## 2020-02-12 DIAGNOSIS — Z510 Encounter for antineoplastic radiation therapy: Secondary | ICD-10-CM | POA: Diagnosis present

## 2020-02-14 ENCOUNTER — Other Ambulatory Visit: Payer: Self-pay | Admitting: *Deleted

## 2020-02-14 DIAGNOSIS — R918 Other nonspecific abnormal finding of lung field: Secondary | ICD-10-CM

## 2020-02-17 DIAGNOSIS — Z510 Encounter for antineoplastic radiation therapy: Secondary | ICD-10-CM | POA: Diagnosis not present

## 2020-02-18 ENCOUNTER — Inpatient Hospital Stay: Payer: Medicare Other | Attending: Internal Medicine | Admitting: Internal Medicine

## 2020-02-18 ENCOUNTER — Other Ambulatory Visit: Payer: Self-pay

## 2020-02-18 ENCOUNTER — Inpatient Hospital Stay: Payer: Medicare Other

## 2020-02-18 ENCOUNTER — Encounter: Payer: Self-pay | Admitting: Internal Medicine

## 2020-02-18 DIAGNOSIS — Z950 Presence of cardiac pacemaker: Secondary | ICD-10-CM | POA: Diagnosis not present

## 2020-02-18 DIAGNOSIS — I4891 Unspecified atrial fibrillation: Secondary | ICD-10-CM | POA: Insufficient documentation

## 2020-02-18 DIAGNOSIS — C7951 Secondary malignant neoplasm of bone: Secondary | ICD-10-CM | POA: Insufficient documentation

## 2020-02-18 DIAGNOSIS — M25552 Pain in left hip: Secondary | ICD-10-CM | POA: Diagnosis not present

## 2020-02-18 DIAGNOSIS — J8410 Pulmonary fibrosis, unspecified: Secondary | ICD-10-CM | POA: Diagnosis not present

## 2020-02-18 DIAGNOSIS — Z87891 Personal history of nicotine dependence: Secondary | ICD-10-CM | POA: Insufficient documentation

## 2020-02-18 DIAGNOSIS — Z7901 Long term (current) use of anticoagulants: Secondary | ICD-10-CM | POA: Diagnosis not present

## 2020-02-18 DIAGNOSIS — R918 Other nonspecific abnormal finding of lung field: Secondary | ICD-10-CM

## 2020-02-18 DIAGNOSIS — C3431 Malignant neoplasm of lower lobe, right bronchus or lung: Secondary | ICD-10-CM | POA: Insufficient documentation

## 2020-02-18 DIAGNOSIS — M25551 Pain in right hip: Secondary | ICD-10-CM | POA: Diagnosis not present

## 2020-02-18 LAB — COMPREHENSIVE METABOLIC PANEL
ALT: 15 U/L (ref 0–44)
AST: 20 U/L (ref 15–41)
Albumin: 4 g/dL (ref 3.5–5.0)
Alkaline Phosphatase: 92 U/L (ref 38–126)
Anion gap: 8 (ref 5–15)
BUN: 20 mg/dL (ref 8–23)
CO2: 28 mmol/L (ref 22–32)
Calcium: 9.5 mg/dL (ref 8.9–10.3)
Chloride: 101 mmol/L (ref 98–111)
Creatinine, Ser: 0.89 mg/dL (ref 0.61–1.24)
GFR calc Af Amer: >60 mL/min (ref >60)
GFR calc non Af Amer: >60 mL/min (ref >60)
Glucose, Bld: 95 mg/dL (ref 70–99)
Potassium: 5.4 mmol/L — ABNORMAL HIGH (ref 3.5–5.1)
Sodium: 137 mmol/L (ref 135–145)
Total Bilirubin: 0.6 mg/dL (ref 0.3–1.2)
Total Protein: 7.6 g/dL (ref 6.5–8.1)

## 2020-02-18 LAB — CBC WITH DIFFERENTIAL/PLATELET
Abs Immature Granulocytes: 0.05 10*3/uL (ref 0.00–0.07)
Basophils Absolute: 0.1 10*3/uL (ref 0.0–0.1)
Basophils Relative: 1 %
Eosinophils Absolute: 1 10*3/uL — ABNORMAL HIGH (ref 0.0–0.5)
Eosinophils Relative: 10 %
HCT: 42.9 % (ref 39.0–52.0)
Hemoglobin: 14.2 g/dL (ref 13.0–17.0)
Immature Granulocytes: 1 %
Lymphocytes Relative: 20 %
Lymphs Abs: 2 10*3/uL (ref 0.7–4.0)
MCH: 31.2 pg (ref 26.0–34.0)
MCHC: 33.1 g/dL (ref 30.0–36.0)
MCV: 94.3 fL (ref 80.0–100.0)
Monocytes Absolute: 1.1 10*3/uL — ABNORMAL HIGH (ref 0.1–1.0)
Monocytes Relative: 11 %
Neutro Abs: 5.8 10*3/uL (ref 1.7–7.7)
Neutrophils Relative %: 57 %
Platelets: 269 10*3/uL (ref 150–400)
RBC: 4.55 MIL/uL (ref 4.22–5.81)
RDW: 13.6 % (ref 11.5–15.5)
WBC: 10 10*3/uL (ref 4.0–10.5)
nRBC: 0 % (ref 0.0–0.2)

## 2020-02-18 LAB — LACTATE DEHYDROGENASE: LDH: 162 U/L (ref 98–192)

## 2020-02-18 MED ORDER — TRAMADOL HCL 50 MG PO TABS: 50.0000 mg | ORAL_TABLET | Freq: Three times a day (TID) | ORAL | 0 refills | Status: DC | PRN

## 2020-02-18 NOTE — Assessment & Plan Note (Addendum)
#   Right lower lobe lung mass 2.4 cm in size/indeterminate mediastinal/hilar adenopathy-highly concerning for malignancy [NO Biopsy]; s/p radiation.  However,  April 2021-PET scan shows concern for recurrence; right upper lobe lesion; hilar/-adenopathy; also T11 uptake.  #I discussed my concerns for progressive malignancy based on PET scan. But patient is a poor candidate for biopsy and poor candidate for systemic therapy.  Discussed my reservations of offering any systemic therapy in the absence of any biopsy.  Given patient's borderline performance status-I would recommend the supportive care/hospice.  Recommend evaluation with Josh.  # Bone mets T11-symptomatic. Worse; Awaiting radiation.  Recommend tramadol for pain control.  # Lung fibrosis/ILD-chronic stable.  # History of A. fib on Eliquis; status post permanent pacemaker.  Stable.  #Overall prognosis is poor given patient's lack of any further good treatment options apart from palliative radiation.  I would recommend evaluation with palliative care in the next 2 weeks.  Disposition:  # Labs today- cbc/cmp/LDH # Follow-up in 2 weeks; MD; No labs  # in 2 weeks- Josh- Dr.B  # I reviewed the blood work- with the patient in detail; also reviewed the imaging independently [as summarized above]; and with the patient in detail.

## 2020-02-18 NOTE — Progress Notes (Signed)
Patient here today for follow up regarding lung cancer. Patient has back pain, takes Tylenol at home and would like refill on Tramadol.

## 2020-02-18 NOTE — Progress Notes (Signed)
[place] NOTE  Patient Care Team: Leone Haven, MD as PCP - General (Family Medicine) Noreene Filbert, MD as Radiation Oncologist (Radiation Oncology)  CHIEF COMPLAINTS/PURPOSE OF CONSULTATION: Right lung mass  #Right LL-mass 2.4 cm in size; question hilar/mediastinal adenopathy on PET scan; s/p RT.   #Interstitial lung disease/active smoker  #A. fib Eliquis; permanent pacemaker; compression fractures [Dr.Gaines; Ortho;[place]] Oncology History   No history exists.    HISTORY OF PRESENTING ILLNESS: Patient is a poor historian. Gary [name] 86 y.o.  male long-term history of smoking-s/p radiation of right lower lobe mass.  On a follow-up PET scan patient noted to have right upper lobe uptake; also hilar/mediastinal uptake/concern for progression.  Also noted to have T11 uptake.  Patient is currently awaiting to start radiation for T11 thoracic lesion.  Patient is accompanied by his son to discuss systemic therapy options.  Patient continues to have shortness of breath; chronic cough.  Complains of midthoracic pain.  No headaches.  Review of Systems  Constitutional: Positive for malaise/fatigue and weight loss. Negative for chills, diaphoresis and fever.  HENT: Negative for nosebleeds and sore throat.   Eyes: Negative for double vision.  Respiratory: Positive for cough and shortness of breath. Negative for hemoptysis, sputum production and wheezing.   Cardiovascular: Negative for chest pain, palpitations, orthopnea and leg swelling.  Gastrointestinal: Negative for abdominal pain, blood in stool, constipation, diarrhea, heartburn, melena, nausea and vomiting.  Genitourinary: Negative for dysuria, frequency and urgency.  Musculoskeletal: Positive for back pain and joint pain.  Skin: Negative.  Negative for itching and rash.  Neurological: Negative for dizziness, tingling, focal weakness, weakness and headaches.  Endo/Heme/Allergies: Does not bruise/bleed  easily.  Psychiatric/Behavioral: Negative for depression. The patient is not nervous/anxious and does not have insomnia.      MEDICAL HISTORY:  Past Medical History:  Diagnosis Date  . Atrial fibrillation ([place])   . AV block    s/p Pacemaker placement.  . Cancer [facility])    being treated with radiation for lung cancer  . Chicken pox   . History of blood transfusion    patient and wife do not recall ever receiving a blood transfusion  . Hyperlipidemia   . Hypertension   . Presence of permanent cardiac pacemaker     SURGICAL HISTORY: Past Surgical History:  Procedure Laterality Date  . EYE SURGERY Bilateral    cataract extractions with iol  . HERNIA REPAIR    . KNEE ARTHROSCOPY Left 2016  . KYPHOPLASTY N/A 12/20/2018   Procedure: KYPHOPLASTY L1, L2, L5;  Surgeon: Hessie Knows, MD;  Location: [facility] ORS;  Service: Orthopedics;  Laterality: N/A;  . PACEMAKER PLACEMENT  2010   sss, AF  . PROSTATECTOMY  2008    SOCIAL HISTORY: Social History   Socioeconomic History  . Marital status: Married    Spouse name: Pamala Hurry  . Number of children: Not on file  . Years of education: Not on file  . Highest education level: Not on file  Occupational History  . Occupation: Agricultural consultant    Comment: retired  Tobacco Use  . Smoking status: Current Some Day Smoker    Packs/day: 0.25    Years: 40.00    Pack years: 10.00    Types: Cigarettes  . Smokeless tobacco: Never Used  . Tobacco comment: trying to quit before surgery. smokes 6-7 a day  Substance and Sexual Activity  . Alcohol use: Never    Alcohol/week: 0.0 standard drinks  . Drug  use: No  . Sexual activity: Not Currently  Other Topics Concern  . Not on file  Social History Narrative   qiut smoking dc 2019; smoking 1/2 ppd x 15-20 years; 3drinks/liquior a week; gies with walker at home;    Social Determinants of Health   Financial Resource Strain: Low Risk   . Difficulty of Paying Living Expenses: Not hard at all  Food  Insecurity: No Food Insecurity  . Worried About Charity fundraiser in the Last Year: Never true  . Ran Out of Food in the Last Year: Never true  Transportation Needs: No Transportation Needs  . Lack of Transportation (Medical): No  . Lack of Transportation (Non-Medical): No  Physical Activity: Unknown  . Days of Exercise per Week: 0 days  . Minutes of Exercise per Session: Not on file  Stress:   . Feeling of Stress :   Social Connections:   . Frequency of Communication with Friends and Family:   . Frequency of Social Gatherings with Friends and Family:   . Attends Religious Services:   . Active Member of Clubs or Organizations:   . Attends Archivist Meetings:   Marland Kitchen Marital Status:   Intimate Partner Violence: Not At Risk  . Fear of Current or Ex-Partner: No  . Emotionally Abused: No  . Physically Abused: No  . Sexually Abused: No    FAMILY HISTORY: Family History  Problem Relation Age of Onset  . Cancer Father   . Heart disease Father   . Cancer Brother     ALLERGIES:  has No Known Allergies.  MEDICATIONS:  Current Outpatient Medications  Medication Sig Dispense Refill  . Acetaminophen (TYLENOL PO) Take 500 mg by mouth. As needed for pain every 6 hours    . alendronate (FOSAMAX) 70 MG tablet Take 1 tablet (70 mg total) by mouth every 7 (seven) days. Take with a full glass of water on an empty stomach. 4 tablet 11  . apixaban (ELIQUIS) 2.5 MG TABS tablet Take 2.5 mg by mouth 2 (two) times daily.     . cholecalciferol (VITAMIN D3) 25 MCG (1000 UT) tablet Take 5,000 Units by mouth daily.     Marland Kitchen ibuprofen (ADVIL) 100 MG/5ML suspension Take 200 mg by mouth. As needed for pain    . Multiple Vitamins-Minerals (PRESERVISION AREDS 2) CAPS Take 1 tablet by mouth 2 (two) times daily.    . naloxone (NARCAN) nasal spray 4 mg/0.1 mL 1 spray into nostril upon signs of opioid overdose (unresponsiveness). Call 911. May repeat once if no response within 2-3 minutes. 1 kit 0  .  traMADol (ULTRAM) 50 MG tablet Take 1 tablet (50 mg total) by mouth every 8 (eight) hours as needed for moderate pain. 60 tablet 0  . Vitamin D, Ergocalciferol, (DRISDOL) 1.25 MG (50000 UT) CAPS capsule Take 1 capsule (50,000 Units total) by mouth every 7 (seven) days. 8 capsule 0   No current facility-administered medications for this visit.      Marland Kitchen  PHYSICAL EXAMINATION: ECOG PERFORMANCE STATUS: 1 - Symptomatic but completely ambulatory  Vitals:   02/18/20 1032  BP: (!) 105/50  Pulse: 71  Resp: 18   Filed Weights   02/18/20 1032  Weight: 188 lb 12.8 oz (85.6 kg)    Physical Exam  Constitutional: He is oriented to person, place, and time.  Frail-appearing Caucasian male patient.  He is in a wheelchair.  He is accompanied by his son.  HENT:  Head: Normocephalic and atraumatic.  Mouth/Throat: Oropharynx is clear and moist. No oropharyngeal exudate.  Eyes: Pupils are equal, round, and reactive to light.  Cardiovascular: Normal rate and regular rhythm.  Decreased air entry bilaterally.  Pulmonary/Chest: No respiratory distress. He has no wheezes.  Abdominal: Soft. Bowel sounds are normal. He exhibits no distension and no mass. There is no abdominal tenderness. There is no rebound and no guarding.  Musculoskeletal:        General: No tenderness or edema. Normal range of motion.     Cervical back: Normal range of motion and neck supple.  Neurological: He is alert and oriented to person, place, and time.  Skin: Skin is warm.  Psychiatric: Affect normal.     LABORATORY DATA:  I have reviewed the data as listed Lab Results  Component Value Date   WBC 10.0 02/18/2020   HGB 14.2 02/18/2020   HCT 42.9 02/18/2020   MCV 94.3 02/18/2020   PLT 269 02/18/2020   Recent Labs    05/01/19 1132 05/01/19 1132 07/10/19 0915 01/17/20 1034 02/18/20 1114  NA 134*  --   --   --  137  K 4.8  --   --   --  5.4*  CL 101  --   --   --  101  CO2 25  --   --   --  28  GLUCOSE 84  --    --   --  95  BUN 15  --   --   --  20  CREATININE 0.68   < > 0.80 0.90 0.89  CALCIUM 9.0  --   --   --  9.5  GFRNONAA  --   --   --   --  >60  GFRAA  --   --   --   --  >60  PROT 6.6  --   --   --  7.6  ALBUMIN 3.8  --   --   --  4.0  AST 14  --   --   --  20  ALT 10  --   --   --  15  ALKPHOS 106  --   --   --  92  BILITOT 0.8  --   --   --  0.6   < > = values in this interval not displayed.    RADIOGRAPHIC STUDIES: I have personally reviewed the radiological images as listed and agreed with the findings in the report. NM PET Image Restag (PS) Skull Base To Thigh  Result Date: 02/03/2020 CLINICAL DATA:  Subsequent treatment strategy for non-small cell lung cancer. Progressive findings on chest CT of 01/17/2020. EXAM: NUCLEAR MEDICINE PET SKULL BASE TO THIGH TECHNIQUE: 10.4 mCi F-18 FDG was injected intravenously. Full-ring PET imaging was performed from the skull base to thigh after the radiotracer. CT data was obtained and used for attenuation correction and anatomic localization. Fasting blood glucose: 66 mg/dl COMPARISON:  Multiple exams, including CT chest 01/17/2020 and PET-CT from 11/01/2018 FINDINGS: Mediastinal blood pool activity: SUV max 2.9 Liver activity: SUV max N/A NECK: No significant abnormal hypermetabolic activity in this region. Incidental CT findings: Periventricular white matter hypodensities in the brain favoring chronic ischemic microvascular white matter disease. Bilateral common carotid atherosclerotic calcifications. Chronic ethmoid and left maxillary sinusitis. CHEST: The primarily right upper lobe nodule spanning along the major fissure measures 2.5 by 1.7 cm on image 130/3, and has maximum SUV of 8.3 compatible with malignancy. This lesion was not present on the prior  PET-CT and is roughly similar in size compared to the CT of 01/17/2020. A right hilar lymph node previously measured at 1.3 cm in short axis on 24/26/8341 is hypermetabolic with a maximum SUV of 8.8,  compatible with malignancy. There is airspace opacity posteriorly in the right lower lobe likely reflecting radiation therapy related findings, maximum SUV 3.5. The previous nodule in this vicinity on prior PET-CT had a maximum SUV of 6.6. Lower right paratracheal node 1.1 cm in short axis on image 113/3, maximum SUV 3.9. Subcarinal node 1.1 cm in short axis on image 118/3, maximum SUV 4.3. Incidental CT findings: Coronary, aortic arch, and branch vessel atherosclerotic vascular disease. Mild cardiomegaly. Dual lead pacer. Bilateral airway thickening. Paraseptal emphysema. ABDOMEN/PELVIS: No significant abnormal hypermetabolic activity in this region. Incidental CT findings: Vascular calcifications in both renal hila. Cannot exclude punctate nonobstructive left mid kidney calculus on image 179/3. Sigmoid colon diverticulosis. Prostatectomy. SKELETON: Abnormal hypermetabolic focus posteriorly in the T11 vertebral body maximum SUV 8.1, most compatible with a small metastatic lesion. Compressions with vertebral augmentations at L1 and L2 as well as L5, with some low-grade adjacent metabolic activity particularly along the L5 methacrylate Incidental CT findings: none IMPRESSION: 1. Hypermetabolic right upper lobe nodule spanning along part of the major fissure, maximum SUV 8.3, compatible with malignancy. Hypermetabolic enlarged right hilar lymph node with maximum SUV 8.8, compatible with malignancy. 2. Right lower paratracheal and subcarinal lymph nodes have activity mildly above blood pool but are considered indeterminate for malignancy, although suspicious. 3. Focal hypermetabolic activity posteriorly in the T11 vertebral body, maximum SUV 8.1, suspicious for a small metastatic lesion. 4. Radiation therapy related findings in the right lower lobe, maximum SUV 3.5, no definite active malignancy in this opacity although surveillance is suggested. 5. Other imaging findings of potential clinical significance:  Intracranial chronic ischemic microvascular white matter disease. Chronic ethmoid and left maxillary sinusitis. Aortic Atherosclerosis (ICD10-I70.0). Coronary atherosclerosis. Mild cardiomegaly. Pacer noted. Airway thickening is present, suggesting bronchitis or reactive airways disease. Paraseptal emphysema. Prostatectomy. Questionable nonobstructive left nephrolithiasis. Aortic Atherosclerosis (ICD10-I70.0) and Emphysema (ICD10-J43.9). Electronically Signed   By: Van Clines M.D.   On: 02/03/2020 15:52    ASSESSMENT & PLAN:   Right lower lobe lung mass # Right lower lobe lung mass 2.4 cm in size/indeterminate mediastinal/hilar adenopathy-highly concerning for malignancy [NO Biopsy]; s/p radiation.  However,  April 2021-PET scan shows concern for recurrence; right upper lobe lesion; hilar/-adenopathy; also T11 uptake.  #I discussed my concerns for progressive malignancy based on PET scan. But patient is a poor candidate for biopsy and poor candidate for systemic therapy.  Discussed my reservations of offering any systemic therapy in the absence of any biopsy.  # Bone mets T11-symptomatic.  Awaiting radiation.   # Lung fibrosis/ILD-chronic stable.  # History of A. fib on Eliquis; status post permanent pacemaker.  Stable.  #Overall prognosis is poor given patient's lack of any further good treatment options apart from palliative radiation.  I would recommend evaluation with palliative care in the next 2 weeks.  Disposition:  # Labs today- cbc/cmp/LDH # Follow-up in 2 weeks; MD; No labs  # in 2 weeks- Josh- Dr.B  # I reviewed the blood work- with the patient in detail; also reviewed the imaging independently [as summarized above]; and with the patient in detail.      All questions were answered. The patient knows to call the clinic with any problems, questions or concerns.    Cammie Sickle, MD 02/25/2020 12:35 AM

## 2020-02-20 ENCOUNTER — Ambulatory Visit
Admission: RE | Admit: 2020-02-20 | Discharge: 2020-02-20 | Disposition: A | Discharge status: Home / Self Care | Payer: Medicare Other | Source: Ambulatory Visit | Attending: Radiation Oncology | Admitting: Radiation Oncology

## 2020-02-20 ENCOUNTER — Encounter: Payer: Self-pay | Admitting: *Deleted

## 2020-02-20 DIAGNOSIS — Z510 Encounter for antineoplastic radiation therapy: Secondary | ICD-10-CM | POA: Diagnosis not present

## 2020-02-21 ENCOUNTER — Ambulatory Visit
Admission: RE | Admit: 2020-02-21 | Discharge: 2020-02-21 | Disposition: A | Discharge status: Home / Self Care | Payer: Medicare Other | Source: Ambulatory Visit | Attending: Radiation Oncology | Admitting: Radiation Oncology

## 2020-02-21 DIAGNOSIS — Z510 Encounter for antineoplastic radiation therapy: Secondary | ICD-10-CM | POA: Diagnosis not present

## 2020-02-24 ENCOUNTER — Ambulatory Visit
Admission: RE | Admit: 2020-02-24 | Discharge: 2020-02-24 | Disposition: A | Discharge status: Home / Self Care | Payer: Medicare Other | Source: Ambulatory Visit | Attending: Radiation Oncology | Admitting: Radiation Oncology

## 2020-02-24 DIAGNOSIS — Z510 Encounter for antineoplastic radiation therapy: Secondary | ICD-10-CM | POA: Diagnosis present

## 2020-02-24 DIAGNOSIS — C3431 Malignant neoplasm of lower lobe, right bronchus or lung: Secondary | ICD-10-CM | POA: Diagnosis present

## 2020-02-25 ENCOUNTER — Ambulatory Visit
Admission: RE | Admit: 2020-02-25 | Discharge: 2020-02-25 | Disposition: A | Discharge status: Home / Self Care | Payer: Medicare Other | Source: Ambulatory Visit | Attending: Radiation Oncology | Admitting: Radiation Oncology

## 2020-02-25 DIAGNOSIS — Z510 Encounter for antineoplastic radiation therapy: Secondary | ICD-10-CM | POA: Diagnosis not present

## 2020-02-26 ENCOUNTER — Ambulatory Visit
Admission: RE | Admit: 2020-02-26 | Discharge: 2020-02-26 | Disposition: A | Discharge status: Home / Self Care | Payer: Medicare Other | Source: Ambulatory Visit | Attending: Radiation Oncology | Admitting: Radiation Oncology

## 2020-02-26 DIAGNOSIS — Z510 Encounter for antineoplastic radiation therapy: Secondary | ICD-10-CM | POA: Diagnosis not present

## 2020-02-27 ENCOUNTER — Ambulatory Visit
Admission: RE | Admit: 2020-02-27 | Discharge: 2020-02-27 | Disposition: A | Discharge status: Home / Self Care | Payer: Medicare Other | Source: Ambulatory Visit | Attending: Radiation Oncology | Admitting: Radiation Oncology

## 2020-02-27 DIAGNOSIS — Z510 Encounter for antineoplastic radiation therapy: Secondary | ICD-10-CM | POA: Diagnosis not present

## 2020-02-28 ENCOUNTER — Ambulatory Visit
Admission: RE | Admit: 2020-02-28 | Discharge: 2020-02-28 | Disposition: A | Discharge status: Home / Self Care | Payer: Medicare Other | Source: Ambulatory Visit | Attending: Radiation Oncology | Admitting: Radiation Oncology

## 2020-02-28 DIAGNOSIS — Z510 Encounter for antineoplastic radiation therapy: Secondary | ICD-10-CM | POA: Diagnosis not present

## 2020-03-02 ENCOUNTER — Ambulatory Visit
Admission: RE | Admit: 2020-03-02 | Discharge: 2020-03-02 | Disposition: A | Discharge status: Home / Self Care | Payer: Medicare Other | Source: Ambulatory Visit | Attending: Radiation Oncology | Admitting: Radiation Oncology

## 2020-03-02 DIAGNOSIS — Z510 Encounter for antineoplastic radiation therapy: Secondary | ICD-10-CM | POA: Diagnosis not present

## 2020-03-03 ENCOUNTER — Ambulatory Visit
Admission: RE | Admit: 2020-03-03 | Discharge: 2020-03-03 | Disposition: A | Discharge status: Home / Self Care | Payer: Medicare Other | Source: Ambulatory Visit | Attending: Radiation Oncology | Admitting: Radiation Oncology

## 2020-03-03 ENCOUNTER — Inpatient Hospital Stay: Payer: Medicare Other | Attending: Internal Medicine | Admitting: Internal Medicine

## 2020-03-03 ENCOUNTER — Other Ambulatory Visit: Payer: Self-pay

## 2020-03-03 ENCOUNTER — Inpatient Hospital Stay (HOSPITAL_BASED_OUTPATIENT_CLINIC_OR_DEPARTMENT_OTHER): Payer: Medicare Other | Admitting: Hospice and Palliative Medicine

## 2020-03-03 ENCOUNTER — Encounter: Payer: Self-pay | Admitting: Internal Medicine

## 2020-03-03 DIAGNOSIS — Z510 Encounter for antineoplastic radiation therapy: Secondary | ICD-10-CM | POA: Diagnosis not present

## 2020-03-03 DIAGNOSIS — I10 Essential (primary) hypertension: Secondary | ICD-10-CM | POA: Diagnosis not present

## 2020-03-03 DIAGNOSIS — F17210 Nicotine dependence, cigarettes, uncomplicated: Secondary | ICD-10-CM | POA: Diagnosis not present

## 2020-03-03 DIAGNOSIS — Z7901 Long term (current) use of anticoagulants: Secondary | ICD-10-CM | POA: Insufficient documentation

## 2020-03-03 DIAGNOSIS — Z515 Encounter for palliative care: Secondary | ICD-10-CM

## 2020-03-03 DIAGNOSIS — I4891 Unspecified atrial fibrillation: Secondary | ICD-10-CM | POA: Diagnosis not present

## 2020-03-03 DIAGNOSIS — G893 Neoplasm related pain (acute) (chronic): Secondary | ICD-10-CM | POA: Diagnosis not present

## 2020-03-03 DIAGNOSIS — C3431 Malignant neoplasm of lower lobe, right bronchus or lung: Secondary | ICD-10-CM | POA: Diagnosis present

## 2020-03-03 DIAGNOSIS — Z950 Presence of cardiac pacemaker: Secondary | ICD-10-CM | POA: Insufficient documentation

## 2020-03-03 DIAGNOSIS — R918 Other nonspecific abnormal finding of lung field: Secondary | ICD-10-CM

## 2020-03-03 DIAGNOSIS — I2510 Atherosclerotic heart disease of native coronary artery without angina pectoris: Secondary | ICD-10-CM | POA: Insufficient documentation

## 2020-03-03 DIAGNOSIS — C7951 Secondary malignant neoplasm of bone: Secondary | ICD-10-CM | POA: Diagnosis not present

## 2020-03-03 MED ORDER — DEXAMETHASONE 2 MG PO TABS: 2.0000 mg | ORAL_TABLET | Freq: Two times a day (BID) | ORAL | 0 refills | Status: DC

## 2020-03-03 MED ORDER — ONDANSETRON HCL 4 MG PO TABS: 4.0000 mg | ORAL_TABLET | Freq: Three times a day (TID) | ORAL | 0 refills | Status: DC | PRN

## 2020-03-03 MED ORDER — OXYCODONE HCL 5 MG PO TABS: 5.0000 mg | ORAL_TABLET | ORAL | 0 refills | Status: DC | PRN

## 2020-03-03 NOTE — Progress Notes (Signed)
[facility] CONSULT NOTE  Patient Care Team: Sonnenberg, Eric G, MD as PCP - General (Family Medicine) Chrystal, Glenn, MD as Radiation Oncologist (Radiation Oncology)  CHIEF COMPLAINTS/PURPOSE OF CONSULTATION: Right lung mass  #Right LL-mass 2.4 cm in size; question hilar/mediastinal adenopathy on PET scan; s/p RT.   #Interstitial lung disease/active smoker  #A. fib Eliquis; permanent pacemaker; compression fractures [Dr.Gaines; Ortho;[place]] Oncology History   No history exists.    HISTORY OF PRESENTING ILLNESS: Patient is a poor historian. Gary [name] 86 y.o.  male patient with recurrent lung cancer based on imaging/PET scan.  Patient is currently undergoing radiation to his T11; vertebral region for pain control.  Patient complains of pain in his bilateral hips.  Difficulty with ambulation.  Poor appetite.  Review of Systems  Constitutional: Positive for malaise/fatigue and weight loss. Negative for chills, diaphoresis and fever.  HENT: Negative for nosebleeds and sore throat.   Eyes: Negative for double vision.  Respiratory: Positive for cough and shortness of breath. Negative for hemoptysis, sputum production and wheezing.   Cardiovascular: Negative for chest pain, palpitations, orthopnea and leg swelling.  Gastrointestinal: Negative for abdominal pain, blood in stool, constipation, diarrhea, heartburn, melena, nausea and vomiting.  Genitourinary: Negative for dysuria, frequency and urgency.  Musculoskeletal: Positive for back pain and joint pain.  Skin: Negative.  Negative for itching and rash.  Neurological: Negative for dizziness, tingling, focal weakness, weakness and headaches.  Endo/Heme/Allergies: Does not bruise/bleed easily.  Psychiatric/Behavioral: Negative for depression. The patient is not nervous/anxious and does not have insomnia.      MEDICAL HISTORY:  Past Medical History:  Diagnosis Date  . Atrial fibrillation (HCC)   . AV block    s/p  Pacemaker placement.  . Cancer (HCC)    being treated with radiation for lung cancer  . Chicken pox   . History of blood transfusion    patient and wife do not recall ever receiving a blood transfusion  . Hyperlipidemia   . Hypertension   . Presence of permanent cardiac pacemaker     SURGICAL HISTORY: Past Surgical History:  Procedure Laterality Date  . EYE SURGERY Bilateral    cataract extractions with iol  . HERNIA REPAIR    . KNEE ARTHROSCOPY Left 2016  . KYPHOPLASTY N/A 12/20/2018   Procedure: KYPHOPLASTY L1, L2, L5;  Surgeon: Menz, Michael, MD;  Location: [facility] ORS;  Service: Orthopedics;  Laterality: N/A;  . PACEMAKER PLACEMENT  2010   sss, AF  . PROSTATECTOMY  2008    SOCIAL HISTORY: Social History   Socioeconomic History  . Marital status: Married    Spouse name: barbara  . Number of children: Not on file  . Years of education: Not on file  . Highest education level: Not on file  Occupational History  . Occupation: insurance adjuster    Comment: retired  Tobacco Use  . Smoking status: Current Some Day Smoker    Packs/day: 0.25    Years: 40.00    Pack years: 10.00    Types: Cigarettes  . Smokeless tobacco: Never Used  . Tobacco comment: trying to quit before surgery. smokes 6-7 a day  Substance and Sexual Activity  . Alcohol use: Never    Alcohol/week: 0.0 standard drinks  . Drug use: No  . Sexual activity: Not Currently  Other Topics Concern  . Not on file  Social History Narrative   qiut smoking dc 2019; smoking 1/2 ppd x 15-20 years; 3drinks/liquior a week; gies with walker   at home;    Social Determinants of Health   Financial Resource Strain: Low Risk   . Difficulty of Paying Living Expenses: Not hard at all  Food Insecurity: No Food Insecurity  . Worried About Charity fundraiser in the Last Year: Never true  . Ran Out of Food in the Last Year: Never true  Transportation Needs: No Transportation Needs  . Lack of Transportation (Medical): No  .  Lack of Transportation (Non-Medical): No  Physical Activity: Unknown  . Days of Exercise per Week: 0 days  . Minutes of Exercise per Session: Not on file  Stress:   . Feeling of Stress :   Social Connections:   . Frequency of Communication with Friends and Family:   . Frequency of Social Gatherings with Friends and Family:   . Attends Religious Services:   . Active Member of Clubs or Organizations:   . Attends Archivist Meetings:   Marland Kitchen Marital Status:   Intimate Partner Violence: Not At Risk  . Fear of Current or Ex-Partner: No  . Emotionally Abused: No  . Physically Abused: No  . Sexually Abused: No    FAMILY HISTORY: Family History  Problem Relation Age of Onset  . Cancer Father   . Heart disease Father   . Cancer Brother     ALLERGIES:  has No Known Allergies.  MEDICATIONS:  Current Outpatient Medications  Medication Sig Dispense Refill  . Acetaminophen (TYLENOL PO) Take 500 mg by mouth. As needed for pain every 6 hours    . alendronate (FOSAMAX) 70 MG tablet Take 1 tablet (70 mg total) by mouth every 7 (seven) days. Take with a full glass of water on an empty stomach. 4 tablet 11  . apixaban (ELIQUIS) 2.5 MG TABS tablet Take 2.5 mg by mouth 2 (two) times daily.     . cholecalciferol (VITAMIN D3) 25 MCG (1000 UT) tablet Take 5,000 Units by mouth daily.     Marland Kitchen ibuprofen (ADVIL) 100 MG/5ML suspension Take 200 mg by mouth. As needed for pain    . Multiple Vitamins-Minerals (PRESERVISION AREDS 2) CAPS Take 1 tablet by mouth 2 (two) times daily.    . naloxone (NARCAN) nasal spray 4 mg/0.1 mL 1 spray into nostril upon signs of opioid overdose (unresponsiveness). Call 911. May repeat once if no response within 2-3 minutes. 1 kit 0  . dexamethasone (DECADRON) 2 MG tablet Take 1 tablet (2 mg total) by mouth 2 (two) times daily. 30 tablet 0  . ondansetron (ZOFRAN) 4 MG tablet Take 1 tablet (4 mg total) by mouth every 8 (eight) hours as needed for nausea or vomiting. 20  tablet 0  . oxyCODONE (OXY IR/ROXICODONE) 5 MG immediate release tablet Take 1 tablet (5 mg total) by mouth every 4 (four) hours as needed for severe pain. 60 tablet 0   No current facility-administered medications for this visit.      Marland Kitchen  PHYSICAL EXAMINATION: ECOG PERFORMANCE STATUS: 1 - Symptomatic but completely ambulatory  Vitals:   03/03/20 1026  BP: 107/69  Pulse: 60  Resp: 18  Temp: (!) 95.6 F (35.3 C)   Filed Weights   03/03/20 1026  Weight: 185 lb 11.2 oz (84.2 kg)    Physical Exam  Constitutional: He is oriented to person, place, and time.  Frail-appearing Caucasian male patient.  He is in a wheelchair.  He is accompanied by his son.  HENT:  Head: Normocephalic and atraumatic.  Mouth/Throat: Oropharynx is clear and moist.  No oropharyngeal exudate.  Eyes: Pupils are equal, round, and reactive to light.  Cardiovascular: Normal rate and regular rhythm.  Decreased air entry bilaterally.  Pulmonary/Chest: No respiratory distress. He has no wheezes.  Abdominal: Soft. Bowel sounds are normal. He exhibits no distension and no mass. There is no abdominal tenderness. There is no rebound and no guarding.  Musculoskeletal:        General: No tenderness or edema. Normal range of motion.     Cervical back: Normal range of motion and neck supple.  Neurological: He is alert and oriented to person, place, and time.  Skin: Skin is warm.  Psychiatric: Affect normal.     LABORATORY DATA:  I have reviewed the data as listed Lab Results  Component Value Date   WBC 8.7 03/12/2020   HGB 14.0 03/12/2020   HCT 42.0 03/12/2020   MCV 94.6 03/12/2020   PLT 199 03/12/2020   Recent Labs    05/01/19 1132 05/01/19 1132 07/10/19 0915 01/17/20 1034 02/18/20 1114  NA 134*  --   --   --  137  K 4.8  --   --   --  5.4*  CL 101  --   --   --  101  CO2 25  --   --   --  28  GLUCOSE 84  --   --   --  95  BUN 15  --   --   --  20  CREATININE 0.68   < > 0.80 0.90 0.89  CALCIUM  9.0  --   --   --  9.5  GFRNONAA  --   --   --   --  >60  GFRAA  --   --   --   --  >60  PROT 6.6  --   --   --  7.6  ALBUMIN 3.8  --   --   --  4.0  AST 14  --   --   --  20  ALT 10  --   --   --  15  ALKPHOS 106  --   --   --  92  BILITOT 0.8  --   --   --  0.6   < > = values in this interval not displayed.    RADIOGRAPHIC STUDIES: I have personally reviewed the radiological images as listed and agreed with the findings in the report. No results found.  ASSESSMENT & PLAN:   Right lower lobe lung mass # Right lower lobe lung mass 2.4 cm in size/indeterminate mediastinal/hilar adenopathy-highly concerning for malignancy [NO Biopsy]; s/p radiation.  April 2021-PET scan shows concern for recurrence; right upper lobe lesion; hilar/-adenopathy; also T11 uptake.  # Currently on RT; poor pain controlled [see below].  I had a long discussion with patient/son regarding the overall poor prognosis given progressive malignancy.  Discussed that patient will be a good candidate for hospice-if this condition continues to decline.  For now I think is reasonable to continue radiation for palliative reasons.  # Bone mets T11-symptomatic.  Worse continue radiation.  Discussed regarding additional steroids; narcotics.  Recommend titrating of narcotic pain medication/recent dexamethasone.  Awaiting evaluation with palliative care.  Discussed with Josh.  # Lung fibrosis/ILD-chronic STABLE.   # History of A. fib on Eliquis; status post permanent pacemaker.  Stable  Overall prognosis: Extremely poor; as discussed above-discussed with patient's son; patient would qualify for hospice.  Would recommend hospice referral after patient radiation.  Discussed   with Josh Borders  Disposition:  #Follow-up-MD as needed/follow-up palliative care.    All questions were answered. The patient knows to call the clinic with any problems, questions or concerns.    Cammie Sickle, MD 03/12/2020 5:43 PM

## 2020-03-03 NOTE — Progress Notes (Signed)
Pt in for follow up, reports having severe pain in both hips, unable to sleep well.

## 2020-03-03 NOTE — Progress Notes (Signed)
[place]  [phone] [phone]   Name: Gary [name] Date: 03/03/2020 MRN: [id number]  DOB: 19-Jul-1930  Patient Care Team: Leone Haven, MD as PCP - General (Family Medicine) Noreene Filbert, MD as Radiation Oncologist (Radiation Oncology)    REASON FOR CONSULTATION: Palliative Care consult requested for this 86 y.o. male with multiple medical problems including non-small cell lung cancer right lower lobe, ILD, and CAD.  He has a recent history of an L1 compression fracture following a fall and is status post kyphoplasty.  Patient was deemed not to be a surgical candidate for his lung cancer and he received RT.  PET scan in April 2021 was concerning for recurrence with progression of right upper lobe lesion and hilar adenopathy.  He was also found to have hypermetabolic H74 lesion.  Patient has had severe pain from his compression fracture.  Patient was felt not to be a good candidate for systemic treatment.  Palliative care was referred to discuss goals and manage ongoing symptoms.  SOCIAL HISTORY:    Patient is married and lives at home with his wife.  Patient has 2 sons, one of whom lives locally and the other in the [place].  Patient retired from [employer].  ADVANCE DIRECTIVES:  Not on file  CODE STATUS:   PAST MEDICAL HISTORY: Past Medical History:  Diagnosis Date  . Atrial fibrillation ([place])   . AV block    s/p Pacemaker placement.  . Cancer [facility])    being treated with radiation for lung cancer  . Chicken pox   . History of blood transfusion    patient and wife do not recall ever receiving a blood transfusion  . Hyperlipidemia   . Hypertension   . Presence of permanent cardiac pacemaker     PAST SURGICAL HISTORY:  Past Surgical History:  Procedure Laterality Date  . EYE SURGERY Bilateral    cataract extractions with iol  . HERNIA REPAIR    . KNEE ARTHROSCOPY  Left 2016  . KYPHOPLASTY N/A 12/20/2018   Procedure: KYPHOPLASTY L1, L2, L5;  Surgeon: Hessie Knows, MD;  Location: [facility] ORS;  Service: Orthopedics;  Laterality: N/A;  . PACEMAKER PLACEMENT  2010   sss, AF  . PROSTATECTOMY  2008    HEMATOLOGY/ONCOLOGY HISTORY:  Oncology History   No history exists.    ALLERGIES:  has No Known Allergies.  MEDICATIONS:  Current Outpatient Medications  Medication Sig Dispense Refill  . Acetaminophen (TYLENOL PO) Take 500 mg by mouth. As needed for pain every 6 hours    . alendronate (FOSAMAX) 70 MG tablet Take 1 tablet (70 mg total) by mouth every 7 (seven) days. Take with a full glass of water on an empty stomach. 4 tablet 11  . apixaban (ELIQUIS) 2.5 MG TABS tablet Take 2.5 mg by mouth 2 (two) times daily.     . cholecalciferol (VITAMIN D3) 25 MCG (1000 UT) tablet Take 5,000 Units by mouth daily.     Marland Kitchen ibuprofen (ADVIL) 100 MG/5ML suspension Take 200 mg by mouth. As needed for pain    . Multiple Vitamins-Minerals (PRESERVISION AREDS 2) CAPS Take 1 tablet by mouth 2 (two) times daily.    . naloxone (NARCAN) nasal spray 4 mg/0.1 mL 1 spray into nostril upon signs of opioid overdose (unresponsiveness). Call 911. May repeat once if no response within 2-3 minutes. 1 kit 0  . traMADol (ULTRAM) 50 MG tablet Take 1 tablet (50  mg total) by mouth every 8 (eight) hours as needed for moderate pain. 60 tablet 0   No current facility-administered medications for this visit.    VITAL SIGNS: There were no vitals taken for this visit. There were no vitals filed for this visit.  Estimated body mass index is 24.17 kg/m as calculated from the following:   Height as of 12/20/18: 6' 1.5" (1.867 m).   Weight as of an earlier encounter on 03/03/20: 185 lb 11.2 oz (84.2 kg).  LABS: CBC:    Component Value Date/Time   WBC 10.0 02/18/2020 1114   HGB 14.2 02/18/2020 1114   HGB 15.7 05/08/2015 1212   HCT 42.9 02/18/2020 1114   HCT 46.2 05/08/2015 1212   PLT 269  02/18/2020 1114   PLT 287 05/08/2015 1212   MCV 94.3 02/18/2020 1114   MCV 93 05/08/2015 1212   MCV 95 06/17/2014 1357   NEUTROABS 5.8 02/18/2020 1114   NEUTROABS 6.4 05/08/2015 1212   NEUTROABS 6.4 06/17/2014 1357   LYMPHSABS 2.0 02/18/2020 1114   LYMPHSABS 2.8 05/08/2015 1212   LYMPHSABS 2.4 06/17/2014 1357   MONOABS 1.1 (H) 02/18/2020 1114   MONOABS 1.0 06/17/2014 1357   EOSABS 1.0 (H) 02/18/2020 1114   EOSABS 0.8 (H) 05/08/2015 1212   EOSABS 0.7 06/17/2014 1357   BASOSABS 0.1 02/18/2020 1114   BASOSABS 0.1 05/08/2015 1212   BASOSABS 0.1 06/17/2014 1357   Comprehensive Metabolic Panel:    Component Value Date/Time   NA 137 02/18/2020 1114   NA 136 05/08/2015 1212   NA 126 (L) 06/09/2013 1044   K 5.4 (H) 02/18/2020 1114   K 4.9 06/09/2013 1044   CL 101 02/18/2020 1114   CL 98 06/09/2013 1044   CO2 28 02/18/2020 1114   CO2 24 06/09/2013 1044   BUN 20 02/18/2020 1114   BUN 15 05/08/2015 1212   BUN 14 06/09/2013 1044   CREATININE 0.89 02/18/2020 1114   CREATININE 0.75 10/05/2018 1549   GLUCOSE 95 02/18/2020 1114   GLUCOSE 97 06/09/2013 1044   CALCIUM 9.5 02/18/2020 1114   CALCIUM 9.4 06/09/2013 1044   AST 20 02/18/2020 1114   AST 25 06/09/2013 1044   ALT 15 02/18/2020 1114   ALT 28 06/09/2013 1044   ALKPHOS 92 02/18/2020 1114   ALKPHOS 65 06/09/2013 1044   BILITOT 0.6 02/18/2020 1114   BILITOT 0.7 05/08/2015 1212   BILITOT 0.7 06/09/2013 1044   PROT 7.6 02/18/2020 1114   PROT 7.2 05/08/2015 1212   PROT 7.9 06/09/2013 1044   ALBUMIN 4.0 02/18/2020 1114   ALBUMIN 4.4 05/08/2015 1212   ALBUMIN 4.1 06/09/2013 1044    RADIOGRAPHIC STUDIES: NM PET Image Restag (PS) Skull Base To Thigh  Result Date: 02/03/2020 CLINICAL DATA:  Subsequent treatment strategy for non-small cell lung cancer. Progressive findings on chest CT of 01/17/2020. EXAM: NUCLEAR MEDICINE PET SKULL BASE TO THIGH TECHNIQUE: 10.4 mCi F-18 FDG was injected intravenously. Full-ring PET imaging was  performed from the skull base to thigh after the radiotracer. CT data was obtained and used for attenuation correction and anatomic localization. Fasting blood glucose: 66 mg/dl COMPARISON:  Multiple exams, including CT chest 01/17/2020 and PET-CT from 11/01/2018 FINDINGS: Mediastinal blood pool activity: SUV max 2.9 Liver activity: SUV max N/A NECK: No significant abnormal hypermetabolic activity in this region. Incidental CT findings: Periventricular white matter hypodensities in the brain favoring chronic ischemic microvascular white matter disease. Bilateral common carotid atherosclerotic calcifications. Chronic ethmoid and left maxillary sinusitis. CHEST:  The primarily right upper lobe nodule spanning along the major fissure measures 2.5 by 1.7 cm on image 130/3, and has maximum SUV of 8.3 compatible with malignancy. This lesion was not present on the prior PET-CT and is roughly similar in size compared to the CT of 01/17/2020. A right hilar lymph node previously measured at 1.3 cm in short axis on 88/82/8003 is hypermetabolic with a maximum SUV of 8.8, compatible with malignancy. There is airspace opacity posteriorly in the right lower lobe likely reflecting radiation therapy related findings, maximum SUV 3.5. The previous nodule in this vicinity on prior PET-CT had a maximum SUV of 6.6. Lower right paratracheal node 1.1 cm in short axis on image 113/3, maximum SUV 3.9. Subcarinal node 1.1 cm in short axis on image 118/3, maximum SUV 4.3. Incidental CT findings: Coronary, aortic arch, and branch vessel atherosclerotic vascular disease. Mild cardiomegaly. Dual lead pacer. Bilateral airway thickening. Paraseptal emphysema. ABDOMEN/PELVIS: No significant abnormal hypermetabolic activity in this region. Incidental CT findings: Vascular calcifications in both renal hila. Cannot exclude punctate nonobstructive left mid kidney calculus on image 179/3. Sigmoid colon diverticulosis. Prostatectomy. SKELETON: Abnormal  hypermetabolic focus posteriorly in the T11 vertebral body maximum SUV 8.1, most compatible with a small metastatic lesion. Compressions with vertebral augmentations at L1 and L2 as well as L5, with some low-grade adjacent metabolic activity particularly along the L5 methacrylate Incidental CT findings: none IMPRESSION: 1. Hypermetabolic right upper lobe nodule spanning along part of the major fissure, maximum SUV 8.3, compatible with malignancy. Hypermetabolic enlarged right hilar lymph node with maximum SUV 8.8, compatible with malignancy. 2. Right lower paratracheal and subcarinal lymph nodes have activity mildly above blood pool but are considered indeterminate for malignancy, although suspicious. 3. Focal hypermetabolic activity posteriorly in the T11 vertebral body, maximum SUV 8.1, suspicious for a small metastatic lesion. 4. Radiation therapy related findings in the right lower lobe, maximum SUV 3.5, no definite active malignancy in this opacity although surveillance is suggested. 5. Other imaging findings of potential clinical significance: Intracranial chronic ischemic microvascular white matter disease. Chronic ethmoid and left maxillary sinusitis. Aortic Atherosclerosis (ICD10-I70.0). Coronary atherosclerosis. Mild cardiomegaly. Pacer noted. Airway thickening is present, suggesting bronchitis or reactive airways disease. Paraseptal emphysema. Prostatectomy. Questionable nonobstructive left nephrolithiasis. Aortic Atherosclerosis (ICD10-I70.0) and Emphysema (ICD10-J43.9). Electronically Signed   By: Van Clines M.D.   On: 02/03/2020 15:52    PERFORMANCE STATUS (ECOG) : 2 - Symptomatic, <50% confined to bed  Review of Systems As noted above. Otherwise, a complete review of systems is negative.  Physical Exam General: Frail-appearing in wheelchair Pulmonary: Unlabored Extremities: no edema Skin: no rashes Neurological: Weakness but otherwise nonfocal  IMPRESSION: I met with patient  and his son today in the clinic.  They met today with Dr. Rogue Bussing.  Patient is felt not to be a good candidate for systemic chemotherapy.  Plan is for patient to complete radiation and that hospice would be considered at time of decline.  Son is hopeful that radiation will help improve patient's symptoms and have some therapeutic benefit in prolonging life.  Symptomatically, patient is having persistent bilateral hip pain.  He has tried tramadol and Norco with little effect.  Patient says that he has used oxycodone in the past and thinks that worked better.  Will rotate to oxycodone.  I recommended that he utilize a pain diary.  Would likely consider starting him on an LAO if frequent usage of oxycodone is required.  At present, he says that he is only using  the tramadol or Norco a couple times a day.  We will also start him on dexamethasone.  Discussed CODE STATUS.  Son was unsure if family had arrived at a decision on that.  He plans to speak with his mother and father about that in private.  PLAN: -Best supportive care -Hospice involvement at time of decline -DC tramadol/Norco -Start oxycodone 5 mg every 4 hours as needed (#30) -Consider initiating LAO if needed -Start dexamethasone 76m BID -Pain diary -Zofran 441mPO Q8H as needed -Continue bowel regimen -Discussed CODE STATUS -RTC in 2 weeks  Case and plan discussed with Dr. BrRogue BussingPatient expressed understanding and was in agreement with this plan. He also understands that He can call clinic at any time with any questions, concerns, or complaints.   Time Total: 20 minutes  Visit consisted of counseling and education dealing with the complex and emotionally intense issues of symptom management and palliative care in the setting of serious and potentially life-threatening illness.Greater than 50%  of this time was spent counseling and coordinating care related to the above assessment and plan.  Signed by: JoAltha HarmPhD,  NP-C [phone] Cell)

## 2020-03-03 NOTE — Assessment & Plan Note (Addendum)
#   Right lower lobe lung mass 2.4 cm in size/indeterminate mediastinal/hilar adenopathy-highly concerning for malignancy [NO Biopsy]; s/p radiation.  April 2021-PET scan shows concern for recurrence; right upper lobe lesion; hilar/-adenopathy; also T11 uptake.  # Currently on RT; poor pain controlled [see below].  I had a long discussion with patient/son regarding the overall poor prognosis given progressive malignancy.  Discussed that patient will be a good candidate for hospice-if this condition continues to decline.  For now I think is reasonable to continue radiation for palliative reasons.  # Bone mets T11-symptomatic.  Worse continue radiation.  Discussed regarding additional steroids; narcotics.  Recommend titrating of narcotic pain medication/recent dexamethasone.  Awaiting evaluation with palliative care.  Discussed with Josh.  # Lung fibrosis/ILD-chronic STABLE.   # History of A. fib on Eliquis; status post permanent pacemaker.  Stable  Overall prognosis: Extremely poor; as discussed above-discussed with patient's son; patient would qualify for hospice.  Would recommend hospice referral after patient radiation.  Discussed with Josh Borders  Disposition:  #Follow-up-MD as needed/follow-up palliative care.

## 2020-03-04 ENCOUNTER — Ambulatory Visit
Admission: RE | Admit: 2020-03-04 | Discharge: 2020-03-04 | Disposition: A | Discharge status: Home / Self Care | Payer: Medicare Other | Source: Ambulatory Visit | Attending: Radiation Oncology | Admitting: Radiation Oncology

## 2020-03-04 ENCOUNTER — Ambulatory Visit: Payer: Medicare Other | Admitting: Family Medicine

## 2020-03-04 DIAGNOSIS — Z510 Encounter for antineoplastic radiation therapy: Secondary | ICD-10-CM | POA: Diagnosis not present

## 2020-03-05 ENCOUNTER — Ambulatory Visit
Admission: RE | Admit: 2020-03-05 | Discharge: 2020-03-05 | Disposition: A | Discharge status: Home / Self Care | Payer: Medicare Other | Source: Ambulatory Visit | Attending: Radiation Oncology | Admitting: Radiation Oncology

## 2020-03-05 ENCOUNTER — Inpatient Hospital Stay: Payer: Medicare Other

## 2020-03-05 ENCOUNTER — Other Ambulatory Visit: Payer: Self-pay

## 2020-03-05 DIAGNOSIS — Z510 Encounter for antineoplastic radiation therapy: Secondary | ICD-10-CM | POA: Diagnosis not present

## 2020-03-05 DIAGNOSIS — R918 Other nonspecific abnormal finding of lung field: Secondary | ICD-10-CM

## 2020-03-05 DIAGNOSIS — C3431 Malignant neoplasm of lower lobe, right bronchus or lung: Secondary | ICD-10-CM | POA: Diagnosis not present

## 2020-03-05 LAB — CBC
HCT: 42 % (ref 39.0–52.0)
Hemoglobin: 14 g/dL (ref 13.0–17.0)
MCH: 31.5 pg (ref 26.0–34.0)
MCHC: 33.3 g/dL (ref 30.0–36.0)
MCV: 94.6 fL (ref 80.0–100.0)
Platelets: 254 10*3/uL (ref 150–400)
RBC: 4.44 MIL/uL (ref 4.22–5.81)
RDW: 13.7 % (ref 11.5–15.5)
WBC: 7.2 10*3/uL (ref 4.0–10.5)
nRBC: 0 % (ref 0.0–0.2)

## 2020-03-06 ENCOUNTER — Ambulatory Visit
Admission: RE | Admit: 2020-03-06 | Discharge: 2020-03-06 | Disposition: A | Discharge status: Home / Self Care | Payer: Medicare Other | Source: Ambulatory Visit | Attending: Radiation Oncology | Admitting: Radiation Oncology

## 2020-03-06 DIAGNOSIS — Z510 Encounter for antineoplastic radiation therapy: Secondary | ICD-10-CM | POA: Diagnosis not present

## 2020-03-09 ENCOUNTER — Ambulatory Visit
Admission: RE | Admit: 2020-03-09 | Discharge: 2020-03-09 | Disposition: A | Discharge status: Home / Self Care | Payer: Medicare Other | Source: Ambulatory Visit | Attending: Radiation Oncology | Admitting: Radiation Oncology

## 2020-03-09 DIAGNOSIS — Z510 Encounter for antineoplastic radiation therapy: Secondary | ICD-10-CM | POA: Diagnosis not present

## 2020-03-10 ENCOUNTER — Other Ambulatory Visit: Payer: Self-pay | Admitting: Hospice and Palliative Medicine

## 2020-03-10 ENCOUNTER — Other Ambulatory Visit: Payer: Self-pay | Admitting: *Deleted

## 2020-03-10 ENCOUNTER — Ambulatory Visit
Admission: RE | Admit: 2020-03-10 | Discharge: 2020-03-10 | Disposition: A | Discharge status: Home / Self Care | Payer: Medicare Other | Source: Ambulatory Visit | Attending: Radiation Oncology | Admitting: Radiation Oncology

## 2020-03-10 DIAGNOSIS — Z510 Encounter for antineoplastic radiation therapy: Secondary | ICD-10-CM | POA: Diagnosis not present

## 2020-03-10 MED ORDER — ONDANSETRON HCL 4 MG PO TABS: 4.0000 mg | ORAL_TABLET | Freq: Three times a day (TID) | ORAL | 0 refills | Status: AC | PRN

## 2020-03-10 MED ORDER — OXYCODONE HCL 5 MG PO TABS: 5.0000 mg | ORAL_TABLET | ORAL | 0 refills | Status: DC | PRN

## 2020-03-10 MED ORDER — DEXAMETHASONE 2 MG PO TABS: 2.0000 mg | ORAL_TABLET | Freq: Two times a day (BID) | ORAL | 0 refills | Status: DC

## 2020-03-10 NOTE — Progress Notes (Signed)
Refill of oxycodone requested by patient. PDMP reviewed.

## 2020-03-11 ENCOUNTER — Other Ambulatory Visit: Payer: Self-pay

## 2020-03-11 ENCOUNTER — Encounter: Payer: Self-pay | Admitting: Family Medicine

## 2020-03-11 ENCOUNTER — Ambulatory Visit: Payer: Medicare Other | Admitting: Family Medicine

## 2020-03-11 ENCOUNTER — Ambulatory Visit
Admission: RE | Admit: 2020-03-11 | Discharge: 2020-03-11 | Disposition: A | Discharge status: Home / Self Care | Payer: Medicare Other | Source: Ambulatory Visit | Attending: Radiation Oncology | Admitting: Radiation Oncology

## 2020-03-11 DIAGNOSIS — Z510 Encounter for antineoplastic radiation therapy: Secondary | ICD-10-CM | POA: Diagnosis not present

## 2020-03-11 DIAGNOSIS — M25551 Pain in right hip: Secondary | ICD-10-CM | POA: Diagnosis not present

## 2020-03-11 DIAGNOSIS — R918 Other nonspecific abnormal finding of lung field: Secondary | ICD-10-CM | POA: Diagnosis not present

## 2020-03-11 DIAGNOSIS — I480 Paroxysmal atrial fibrillation: Secondary | ICD-10-CM

## 2020-03-11 DIAGNOSIS — M8000XD Age-related osteoporosis with current pathological fracture, unspecified site, subsequent encounter for fracture with routine healing: Secondary | ICD-10-CM

## 2020-03-11 DIAGNOSIS — M25552 Pain in left hip: Secondary | ICD-10-CM

## 2020-03-11 NOTE — Progress Notes (Signed)
  Tommi Rumps, MD Phone: [phone]  Gary [name] is a 86 y.o. male who presents today for f/u.  Right lower lobe lung mass: Concerning for malignancy.  He is doing radiation for metastatic disease.  He continues to have pain and has been on oxycodone for this.  Bilateral hip pain: This is a chronic issue.  He notes he fell a long time ago and believes he may have had stress fractures.  His son reports that he fell back in January and was in the midst of being treated for lung mass and metastatic disease and has not been in to see orthopedics for the hip pain.  He notes they have the number that he can call once he finishes radiation therapy.  He notes the pain makes it difficult for him to walk and he does walk with a walker.  A. fib: Taking Eliquis.  No palpitations.  No bleeding.  No chest pain.  No shortness of breath.  Osteoporosis: They are unsure if he is still taking Fosamax.  Social History   Tobacco Use  Smoking Status Current Some Day Smoker  . Packs/day: 0.25  . Years: 40.00  . Pack years: 10.00  . Types: Cigarettes  Smokeless Tobacco Never Used  Tobacco Comment   trying to quit before surgery. smokes 6-7 a day     ROS see history of present illness  Objective  Physical Exam Vitals:   03/11/20 1358  BP: 120/60  Pulse: 64  Temp: (!) 97.3 F (36.3 C)  SpO2: 95%    BP Readings from Last 3 Encounters:  03/11/20 120/60  03/03/20 107/69  02/18/20 (!) 105/50   Wt Readings from Last 3 Encounters:  03/03/20 185 lb 11.2 oz (84.2 kg)  02/18/20 188 lb 12.8 oz (85.6 kg)  01/16/20 187 lb 8 oz (85 kg)    Physical Exam Constitutional:      General: He is not in acute distress.    Appearance: He is not diaphoretic.  Cardiovascular:     Rate and Rhythm: Normal rate and regular rhythm.     Heart sounds: Normal heart sounds.  Pulmonary:     Effort: Pulmonary effort is normal.     Breath sounds: Normal breath sounds.  Musculoskeletal:     Comments: Good  internal and external range of motion bilateral hips with no pain  Skin:    General: Skin is warm and dry.  Neurological:     Mental Status: He is alert.      Assessment/Plan: Please see individual problem list.  AF (paroxysmal atrial fibrillation) Asymptomatic.  He will continue Eliquis.  Right lower lobe lung mass Concerning for malignancy.  He will continue treatment and monitoring through oncology and radiation oncology.  Bilateral hip pain Chronic issue.  I encouraged him to see orthopedics as soon as he finishes radiation therapy.  Osteoporosis They will check to see if he is taking Fosamax.   No orders of the defined types were placed in this encounter.   No orders of the defined types were placed in this encounter.   This visit occurred during the SARS-CoV-2 public health emergency.  Safety protocols were in place, including screening questions prior to the visit, additional usage of staff PPE, and extensive cleaning of exam room while observing appropriate contact time as indicated for disinfecting solutions.    Tommi Rumps, MD [place]

## 2020-03-11 NOTE — Assessment & Plan Note (Signed)
Concerning for malignancy.  He will continue treatment and monitoring through oncology and radiation oncology.

## 2020-03-11 NOTE — Assessment & Plan Note (Signed)
Chronic issue.  I encouraged him to see orthopedics as soon as he finishes radiation therapy.

## 2020-03-11 NOTE — Assessment & Plan Note (Signed)
They will check to see if he is taking Fosamax.

## 2020-03-11 NOTE — Patient Instructions (Signed)
Nice to see you. Please check to see if you are taking Fosamax. Please contact the orthopedic doctor when you are ready to.

## 2020-03-11 NOTE — Assessment & Plan Note (Signed)
Asymptomatic.  He will continue Eliquis.

## 2020-03-12 ENCOUNTER — Ambulatory Visit
Admission: RE | Admit: 2020-03-12 | Discharge: 2020-03-12 | Disposition: A | Discharge status: Home / Self Care | Payer: Medicare Other | Source: Ambulatory Visit | Attending: Radiation Oncology | Admitting: Radiation Oncology

## 2020-03-12 ENCOUNTER — Inpatient Hospital Stay: Payer: Medicare Other

## 2020-03-12 DIAGNOSIS — Z510 Encounter for antineoplastic radiation therapy: Secondary | ICD-10-CM | POA: Diagnosis not present

## 2020-03-12 DIAGNOSIS — C3431 Malignant neoplasm of lower lobe, right bronchus or lung: Secondary | ICD-10-CM | POA: Diagnosis not present

## 2020-03-12 DIAGNOSIS — R918 Other nonspecific abnormal finding of lung field: Secondary | ICD-10-CM

## 2020-03-12 LAB — CBC
HCT: 42 % (ref 39.0–52.0)
Hemoglobin: 14 g/dL (ref 13.0–17.0)
MCH: 31.5 pg (ref 26.0–34.0)
MCHC: 33.3 g/dL (ref 30.0–36.0)
MCV: 94.6 fL (ref 80.0–100.0)
Platelets: 199 10*3/uL (ref 150–400)
RBC: 4.44 MIL/uL (ref 4.22–5.81)
RDW: 13.8 % (ref 11.5–15.5)
WBC: 8.7 10*3/uL (ref 4.0–10.5)
nRBC: 0 % (ref 0.0–0.2)

## 2020-03-13 ENCOUNTER — Ambulatory Visit: Payer: Medicare Other | Admitting: Family Medicine

## 2020-03-13 ENCOUNTER — Ambulatory Visit
Admission: RE | Admit: 2020-03-13 | Discharge: 2020-03-13 | Disposition: A | Discharge status: Home / Self Care | Payer: Medicare Other | Source: Ambulatory Visit | Attending: Radiation Oncology | Admitting: Radiation Oncology

## 2020-03-13 DIAGNOSIS — Z510 Encounter for antineoplastic radiation therapy: Secondary | ICD-10-CM | POA: Diagnosis not present

## 2020-03-16 ENCOUNTER — Ambulatory Visit
Admission: RE | Admit: 2020-03-16 | Discharge: 2020-03-16 | Disposition: A | Discharge status: Home / Self Care | Payer: Medicare Other | Source: Ambulatory Visit | Attending: Radiation Oncology | Admitting: Radiation Oncology

## 2020-03-16 ENCOUNTER — Other Ambulatory Visit: Payer: Self-pay

## 2020-03-16 ENCOUNTER — Inpatient Hospital Stay (HOSPITAL_BASED_OUTPATIENT_CLINIC_OR_DEPARTMENT_OTHER): Payer: Medicare Other | Admitting: Hospice and Palliative Medicine

## 2020-03-16 ENCOUNTER — Encounter: Payer: Self-pay | Admitting: Hospice and Palliative Medicine

## 2020-03-16 VITALS — BP 109/68 | HR 63 | Temp 97.6°F | Resp 22 | Wt 185.0 lb

## 2020-03-16 DIAGNOSIS — Z515 Encounter for palliative care: Secondary | ICD-10-CM

## 2020-03-16 DIAGNOSIS — Z7189 Other specified counseling: Secondary | ICD-10-CM

## 2020-03-16 DIAGNOSIS — G893 Neoplasm related pain (acute) (chronic): Secondary | ICD-10-CM

## 2020-03-16 DIAGNOSIS — C3431 Malignant neoplasm of lower lobe, right bronchus or lung: Secondary | ICD-10-CM | POA: Diagnosis not present

## 2020-03-16 DIAGNOSIS — R918 Other nonspecific abnormal finding of lung field: Secondary | ICD-10-CM | POA: Diagnosis not present

## 2020-03-16 DIAGNOSIS — Z510 Encounter for antineoplastic radiation therapy: Secondary | ICD-10-CM | POA: Diagnosis not present

## 2020-03-16 MED ORDER — OXYCODONE HCL ER 10 MG PO T12A: 10.0000 mg | EXTENDED_RELEASE_TABLET | Freq: Two times a day (BID) | ORAL | 0 refills | Status: DC

## 2020-03-16 MED ORDER — OXYCODONE HCL 5 MG PO TABS: 5.0000 mg | ORAL_TABLET | ORAL | 0 refills | Status: AC | PRN

## 2020-03-16 NOTE — Progress Notes (Signed)
[place]  [phone] [phone]   Name: Gary [name] Date: 03/16/2020 MRN: [id number]  DOB: [birth year]  Patient Care Team: Leone Haven, MD as PCP - General (Family Medicine) Noreene Filbert, MD as Radiation Oncologist (Radiation Oncology)    REASON FOR CONSULTATION: Palliative Care consult requested for this 86 y.o. male with multiple medical problems including non-small cell lung cancer right lower lobe, ILD, and CAD.  He has a recent history of an L1 compression fracture following a fall and is status post kyphoplasty.  Patient was deemed not to be a surgical candidate for his lung cancer and he received RT.  PET scan in April 2021 was concerning for recurrence with progression of right upper lobe lesion and hilar adenopathy.  He was also found to have hypermetabolic G81 lesion.  Patient has had severe pain from his compression fracture.  Patient was felt not to be a good candidate for systemic treatment.  Palliative care was referred to discuss goals and manage ongoing symptoms.  SOCIAL HISTORY:    Patient is married and lives at home with his wife.  Patient has 2 sons, one of whom lives locally and the other in the [place].  Patient retired from [employer].  ADVANCE DIRECTIVES:  Not on file  CODE STATUS:   PAST MEDICAL HISTORY: Past Medical History:  Diagnosis Date  . Atrial fibrillation ([place])   . AV block    s/p Pacemaker placement.  . Cancer [facility])    being treated with radiation for lung cancer  . Chicken pox   . History of blood transfusion    patient and wife do not recall ever receiving a blood transfusion  . Hyperlipidemia   . Hypertension   . Presence of permanent cardiac pacemaker     PAST SURGICAL HISTORY:  Past Surgical History:  Procedure Laterality Date  . EYE SURGERY Bilateral    cataract extractions with iol  . HERNIA REPAIR    . KNEE ARTHROSCOPY  Left 2016  . KYPHOPLASTY N/A 12/20/2018   Procedure: KYPHOPLASTY L1, L2, L5;  Surgeon: Hessie Knows, MD;  Location: [facility] ORS;  Service: Orthopedics;  Laterality: N/A;  . PACEMAKER PLACEMENT  2010   sss, AF  . PROSTATECTOMY  2008    HEMATOLOGY/ONCOLOGY HISTORY:  Oncology History   No history exists.    ALLERGIES:  has No Known Allergies.  MEDICATIONS:  Current Outpatient Medications  Medication Sig Dispense Refill  . Acetaminophen (TYLENOL PO) Take 500 mg by mouth. As needed for pain every 6 hours    . alendronate (FOSAMAX) 70 MG tablet Take 1 tablet (70 mg total) by mouth every 7 (seven) days. Take with a full glass of water on an empty stomach. 4 tablet 11  . apixaban (ELIQUIS) 2.5 MG TABS tablet Take 2.5 mg by mouth 2 (two) times daily.     . cholecalciferol (VITAMIN D3) 25 MCG (1000 UT) tablet Take 5,000 Units by mouth daily.     Marland Kitchen dexamethasone (DECADRON) 2 MG tablet Take 1 tablet (2 mg total) by mouth 2 (two) times daily. 30 tablet 0  . ibuprofen (ADVIL) 100 MG/5ML suspension Take 200 mg by mouth. As needed for pain    . Multiple Vitamins-Minerals (PRESERVISION AREDS 2) CAPS Take 1 tablet by mouth 2 (two) times daily.    . naloxone (NARCAN) nasal spray 4 mg/0.1 mL 1 spray into nostril upon signs of opioid overdose (unresponsiveness). Call 911. May  repeat once if no response within 2-3 minutes. 1 kit 0  . ondansetron (ZOFRAN) 4 MG tablet Take 1 tablet (4 mg total) by mouth every 8 (eight) hours as needed for nausea or vomiting. 20 tablet 0  . oxyCODONE (OXY IR/ROXICODONE) 5 MG immediate release tablet Take 1 tablet (5 mg total) by mouth every 4 (four) hours as needed for severe pain. 60 tablet 0   No current facility-administered medications for this visit.    VITAL SIGNS: There were no vitals taken for this visit. There were no vitals filed for this visit.  Estimated body mass index is 24.17 kg/m as calculated from the following:   Height as of 12/20/18: 6' 1.5" (1.867 m).    Weight as of 03/03/20: 185 lb 11.2 oz (84.2 kg).  LABS: CBC:    Component Value Date/Time   WBC 8.7 03/12/2020 1009   HGB 14.0 03/12/2020 1009   HGB 15.7 05/08/2015 1212   HCT 42.0 03/12/2020 1009   HCT 46.2 05/08/2015 1212   PLT 199 03/12/2020 1009   PLT 287 05/08/2015 1212   MCV 94.6 03/12/2020 1009   MCV 93 05/08/2015 1212   MCV 95 06/17/2014 1357   NEUTROABS 5.8 02/18/2020 1114   NEUTROABS 6.4 05/08/2015 1212   NEUTROABS 6.4 06/17/2014 1357   LYMPHSABS 2.0 02/18/2020 1114   LYMPHSABS 2.8 05/08/2015 1212   LYMPHSABS 2.4 06/17/2014 1357   MONOABS 1.1 (H) 02/18/2020 1114   MONOABS 1.0 06/17/2014 1357   EOSABS 1.0 (H) 02/18/2020 1114   EOSABS 0.8 (H) 05/08/2015 1212   EOSABS 0.7 06/17/2014 1357   BASOSABS 0.1 02/18/2020 1114   BASOSABS 0.1 05/08/2015 1212   BASOSABS 0.1 06/17/2014 1357   Comprehensive Metabolic Panel:    Component Value Date/Time   NA 137 02/18/2020 1114   NA 136 05/08/2015 1212   NA 126 (L) 06/09/2013 1044   K 5.4 (H) 02/18/2020 1114   K 4.9 06/09/2013 1044   CL 101 02/18/2020 1114   CL 98 06/09/2013 1044   CO2 28 02/18/2020 1114   CO2 24 06/09/2013 1044   BUN 20 02/18/2020 1114   BUN 15 05/08/2015 1212   BUN 14 06/09/2013 1044   CREATININE 0.89 02/18/2020 1114   CREATININE 0.75 10/05/2018 1549   GLUCOSE 95 02/18/2020 1114   GLUCOSE 97 06/09/2013 1044   CALCIUM 9.5 02/18/2020 1114   CALCIUM 9.4 06/09/2013 1044   AST 20 02/18/2020 1114   AST 25 06/09/2013 1044   ALT 15 02/18/2020 1114   ALT 28 06/09/2013 1044   ALKPHOS 92 02/18/2020 1114   ALKPHOS 65 06/09/2013 1044   BILITOT 0.6 02/18/2020 1114   BILITOT 0.7 05/08/2015 1212   BILITOT 0.7 06/09/2013 1044   PROT 7.6 02/18/2020 1114   PROT 7.2 05/08/2015 1212   PROT 7.9 06/09/2013 1044   ALBUMIN 4.0 02/18/2020 1114   ALBUMIN 4.4 05/08/2015 1212   ALBUMIN 4.1 06/09/2013 1044    RADIOGRAPHIC STUDIES: No results found.  PERFORMANCE STATUS (ECOG) : 2 - Symptomatic, <50% confined to  bed  Review of Systems As noted above. Otherwise, a complete review of systems is negative.  Physical Exam General: Frail-appearing in wheelchair Pulmonary: Unlabored Extremities: no edema Skin: no rashes Neurological: Weakness but otherwise nonfocal  IMPRESSION: Routine follow-up visit today with patient and son.  I later called and spoke with patient's wife by phone.  Patient reports that bilateral hip pain is significantly improved with use of as needed oxycodone.  Patient denies any adverse  effects from pain medications.  However, patient has required the oxycodone every 4 hours around-the-clock.  Patient is requesting a refill of oxycodone today.  Discussed option of starting a long-acting opioid such as OxyContin to lessen the need for frequent as needed IR oxycodone.  Patient, son, and wife were in agreement with this plan.  Patient also plans to follow-up with Ortho regarding hip pain.   We again discussed CODE STATUS.  Son says that his brother will be coming into town this week and family will discuss decision-making at that time.  Wife says that she feels patient would likely just want to be comfortable at end-of-life and not receive aggressive care.  I again discussed the option of early hospice involvement.  However, wife feels that patient is doing well and does not need hospice care at the present time.  We will consult home-based palliative care to help with monitoring for decline.  PLAN: -Best supportive care -Hospice involvement at time of decline -Will refer to home-based palliative care -Start OxyContin 10 mg every 12 hours (#30) -Continue oxycodone 5 mg every 4 hours as needed for breakthrough pain (#60) -Continue dexamethasone 82m BID -Continue bowel regimen -Discussed CODE STATUS -Follow-up virtual visit in 2 weeks   Patient expressed understanding and was in agreement with this plan. He also understands that He can call clinic at any time with any questions,  concerns, or complaints.   Time Total: 20 minutes  Visit consisted of counseling and education dealing with the complex and emotionally intense issues of symptom management and palliative care in the setting of serious and potentially life-threatening illness.Greater than 50%  of this time was spent counseling and coordinating care related to the above assessment and plan.  Signed by: JAltha Harm PhD, NP-C [phone](Work Cell)

## 2020-03-17 ENCOUNTER — Ambulatory Visit
Admission: RE | Admit: 2020-03-17 | Discharge: 2020-03-17 | Disposition: A | Discharge status: Home / Self Care | Payer: Medicare Other | Source: Ambulatory Visit | Attending: Radiation Oncology | Admitting: Radiation Oncology

## 2020-03-17 DIAGNOSIS — Z510 Encounter for antineoplastic radiation therapy: Secondary | ICD-10-CM | POA: Diagnosis not present

## 2020-03-18 ENCOUNTER — Inpatient Hospital Stay (HOSPITAL_BASED_OUTPATIENT_CLINIC_OR_DEPARTMENT_OTHER): Payer: Medicare Other | Admitting: Hospice and Palliative Medicine

## 2020-03-18 ENCOUNTER — Telehealth: Payer: Self-pay | Admitting: Adult Health Nurse Practitioner

## 2020-03-18 ENCOUNTER — Ambulatory Visit
Admission: RE | Admit: 2020-03-18 | Discharge: 2020-03-18 | Disposition: A | Discharge status: Home / Self Care | Payer: Medicare Other | Source: Ambulatory Visit | Attending: Radiation Oncology | Admitting: Radiation Oncology

## 2020-03-18 DIAGNOSIS — Z515 Encounter for palliative care: Secondary | ICD-10-CM

## 2020-03-18 DIAGNOSIS — R918 Other nonspecific abnormal finding of lung field: Secondary | ICD-10-CM

## 2020-03-18 DIAGNOSIS — G893 Neoplasm related pain (acute) (chronic): Secondary | ICD-10-CM

## 2020-03-18 DIAGNOSIS — Z510 Encounter for antineoplastic radiation therapy: Secondary | ICD-10-CM | POA: Diagnosis not present

## 2020-03-18 DIAGNOSIS — C3431 Malignant neoplasm of lower lobe, right bronchus or lung: Secondary | ICD-10-CM | POA: Diagnosis not present

## 2020-03-18 NOTE — Progress Notes (Signed)
[place]  [phone] [phone]   Name: Gary [name] Date: 03/18/2020 MRN: [id number]  DOB: 05-29-1930  Patient Care Team: Leone Haven, MD as PCP - General (Family Medicine) Noreene Filbert, MD as Radiation Oncologist (Radiation Oncology)    REASON FOR CONSULTATION: Palliative Care consult requested for this 86 y.o. male with multiple medical problems including non-small cell lung cancer right lower lobe, ILD, and CAD.  He has a recent history of an L1 compression fracture following a fall and is status post kyphoplasty.  Patient was deemed not to be a surgical candidate for his lung cancer and he received RT.  PET scan in April 2021 was concerning for recurrence with progression of right upper lobe lesion and hilar adenopathy.  He was also found to have hypermetabolic F38 lesion.  Patient has had severe pain from his compression fracture.  Patient was felt not to be a good candidate for systemic treatment.  Palliative care was referred to discuss goals and manage ongoing symptoms.  SOCIAL HISTORY:    Patient is married and lives at home with his wife.  Patient has 2 sons, one of whom lives locally and the other in the [place].  Patient retired from [employer].  ADVANCE DIRECTIVES:  Not on file  CODE STATUS: DNR/DNI (DNR order complete on 5/26/1)  PAST MEDICAL HISTORY: Past Medical History:  Diagnosis Date  . Atrial fibrillation ([place])   . AV block    s/p Pacemaker placement.  . Cancer [facility])    being treated with radiation for lung cancer  . Chicken pox   . History of blood transfusion    patient and wife do not recall ever receiving a blood transfusion  . Hyperlipidemia   . Hypertension   . Presence of permanent cardiac pacemaker     PAST SURGICAL HISTORY:  Past Surgical History:  Procedure Laterality Date  . EYE SURGERY Bilateral    cataract extractions with iol  .  HERNIA REPAIR    . KNEE ARTHROSCOPY Left 2016  . KYPHOPLASTY N/A 12/20/2018   Procedure: KYPHOPLASTY L1, L2, L5;  Surgeon: Hessie Knows, MD;  Location: [facility] ORS;  Service: Orthopedics;  Laterality: N/A;  . PACEMAKER PLACEMENT  2010   sss, AF  . PROSTATECTOMY  2008    HEMATOLOGY/ONCOLOGY HISTORY:  Oncology History   No history exists.    ALLERGIES:  has No Known Allergies.  MEDICATIONS:  Current Outpatient Medications  Medication Sig Dispense Refill  . Acetaminophen (TYLENOL PO) Take 500 mg by mouth. As needed for pain every 6 hours    . alendronate (FOSAMAX) 70 MG tablet Take 1 tablet (70 mg total) by mouth every 7 (seven) days. Take with a full glass of water on an empty stomach. (Patient not taking: Reported on 03/16/2020) 4 tablet 11  . apixaban (ELIQUIS) 2.5 MG TABS tablet Take 2.5 mg by mouth 2 (two) times daily.     . cholecalciferol (VITAMIN D3) 25 MCG (1000 UT) tablet Take 5,000 Units by mouth daily.     Marland Kitchen dexamethasone (DECADRON) 2 MG tablet Take 1 tablet (2 mg total) by mouth 2 (two) times daily. (Patient not taking: Reported on 03/16/2020) 30 tablet 0  . ibuprofen (ADVIL) 100 MG/5ML suspension Take 200 mg by mouth. As needed for pain    . Multiple Vitamins-Minerals (PRESERVISION AREDS 2) CAPS Take 1 tablet by mouth 2 (two) times daily.    . naloxone [facility]) nasal  spray 4 mg/0.1 mL 1 spray into nostril upon signs of opioid overdose (unresponsiveness). Call 911. May repeat once if no response within 2-3 minutes. (Patient not taking: Reported on 03/16/2020) 1 kit 0  . ondansetron (ZOFRAN) 4 MG tablet Take 1 tablet (4 mg total) by mouth every 8 (eight) hours as needed for nausea or vomiting. (Patient not taking: Reported on 03/16/2020) 20 tablet 0  . oxyCODONE (OXY IR/ROXICODONE) 5 MG immediate release tablet Take 1 tablet (5 mg total) by mouth every 4 (four) hours as needed for severe pain. 60 tablet 0  . oxyCODONE (OXYCONTIN) 10 mg 12 hr tablet Take 1 tablet (10 mg total) by mouth  every 12 (twelve) hours. 30 tablet 0   No current facility-administered medications for this visit.    VITAL SIGNS: There were no vitals taken for this visit. There were no vitals filed for this visit.  Estimated body mass index is 24.08 kg/m as calculated from the following:   Height as of 12/20/18: 6' 1.5" (1.867 m).   Weight as of 03/16/20: 185 lb (83.9 kg).  LABS: CBC:    Component Value Date/Time   WBC 8.7 03/12/2020 1009   HGB 14.0 03/12/2020 1009   HGB 15.7 05/08/2015 1212   HCT 42.0 03/12/2020 1009   HCT 46.2 05/08/2015 1212   PLT 199 03/12/2020 1009   PLT 287 05/08/2015 1212   MCV 94.6 03/12/2020 1009   MCV 93 05/08/2015 1212   MCV 95 06/17/2014 1357   NEUTROABS 5.8 02/18/2020 1114   NEUTROABS 6.4 05/08/2015 1212   NEUTROABS 6.4 06/17/2014 1357   LYMPHSABS 2.0 02/18/2020 1114   LYMPHSABS 2.8 05/08/2015 1212   LYMPHSABS 2.4 06/17/2014 1357   MONOABS 1.1 (H) 02/18/2020 1114   MONOABS 1.0 06/17/2014 1357   EOSABS 1.0 (H) 02/18/2020 1114   EOSABS 0.8 (H) 05/08/2015 1212   EOSABS 0.7 06/17/2014 1357   BASOSABS 0.1 02/18/2020 1114   BASOSABS 0.1 05/08/2015 1212   BASOSABS 0.1 06/17/2014 1357   Comprehensive Metabolic Panel:    Component Value Date/Time   NA 137 02/18/2020 1114   NA 136 05/08/2015 1212   NA 126 (L) 06/09/2013 1044   K 5.4 (H) 02/18/2020 1114   K 4.9 06/09/2013 1044   CL 101 02/18/2020 1114   CL 98 06/09/2013 1044   CO2 28 02/18/2020 1114   CO2 24 06/09/2013 1044   BUN 20 02/18/2020 1114   BUN 15 05/08/2015 1212   BUN 14 06/09/2013 1044   CREATININE 0.89 02/18/2020 1114   CREATININE 0.75 10/05/2018 1549   GLUCOSE 95 02/18/2020 1114   GLUCOSE 97 06/09/2013 1044   CALCIUM 9.5 02/18/2020 1114   CALCIUM 9.4 06/09/2013 1044   AST 20 02/18/2020 1114   AST 25 06/09/2013 1044   ALT 15 02/18/2020 1114   ALT 28 06/09/2013 1044   ALKPHOS 92 02/18/2020 1114   ALKPHOS 65 06/09/2013 1044   BILITOT 0.6 02/18/2020 1114   BILITOT 0.7 05/08/2015  1212   BILITOT 0.7 06/09/2013 1044   PROT 7.6 02/18/2020 1114   PROT 7.2 05/08/2015 1212   PROT 7.9 06/09/2013 1044   ALBUMIN 4.0 02/18/2020 1114   ALBUMIN 4.4 05/08/2015 1212   ALBUMIN 4.1 06/09/2013 1044    RADIOGRAPHIC STUDIES: No results found.  PERFORMANCE STATUS (ECOG) : 2 - Symptomatic, <50% confined to bed  Review of Systems As noted above. Otherwise, a complete review of systems is negative.  Physical Exam General: Frail-appearing in wheelchair Pulmonary: Unlabored Extremities: no  edema Skin: no rashes Neurological: Weakness but otherwise nonfocal  IMPRESSION: Patient was an add-on to my clinic schedule today and family request.  I met with patient's son and wife.  We discussed patient's pain regimen in detail.  Family brought in bottles of oxycodone and OxyContin.  Wife says that when she denied last spoke she was confused.  She now denies that patient has ever taken either oxycodone or OxyContin.  Both pill bottles appear to be full.  She says she was instead giving patient Tylenol for pain.  I advised her to not start the OxyContin.  In fact I will discontinue this from patient's medication list as it is now apparent that he is opioid nave.  Patient may take oxycodone 5 mg as needed for severe pain.  He may continue acetaminophen as needed.  We discussed CODE STATUS.  Patient/family are in agreement with DNR/DNI.  They say that he would not want to be resuscitated or have his life prolonged artificially machines.  PLAN: -Best supportive care -Hospice involvement at time of decline -Oxycodone 5 mg every 4 hours as needed for breakthrough pain -Continue dexamethasone 36m BID -Continue bowel regimen -DNR/DNI -Follow-up virtual visit in 2 weeks   Patient expressed understanding and was in agreement with this plan. He also understands that He can call clinic at any time with any questions, concerns, or complaints.   Time Total: 15 minutes  Visit consisted of  counseling and education dealing with the complex and emotionally intense issues of symptom management and palliative care in the setting of serious and potentially life-threatening illness.Greater than 50%  of this time was spent counseling and coordinating care related to the above assessment and plan.  Signed by: JAltha Harm PhD, NP-C [phone](Work Cell)

## 2020-03-18 NOTE — Telephone Encounter (Signed)
Spoke with patient's wife, Pamala Hurry, regarding Palliative services and all questions answered and she was in agreement with this.  I have scheduled an In-person Consult for 03/25/20 @ 3:30 PM

## 2020-03-24 ENCOUNTER — Telehealth: Payer: Self-pay | Admitting: Adult Health Nurse Practitioner

## 2020-03-24 NOTE — Telephone Encounter (Signed)
Rec'd message that wife had called and requested a call back.  When I spoke with her she has now changed her mind and would like Palliative services for her husband.  I have rescheduled the Pallative Consult for 03/25/20 @ 3:30 PM.

## 2020-03-25 ENCOUNTER — Other Ambulatory Visit: Payer: Self-pay | Admitting: Adult Health Nurse Practitioner

## 2020-03-25 ENCOUNTER — Other Ambulatory Visit: Payer: Self-pay

## 2020-03-25 ENCOUNTER — Other Ambulatory Visit: Payer: Medicare Other | Admitting: Adult Health Nurse Practitioner

## 2020-03-25 DIAGNOSIS — C3490 Malignant neoplasm of unspecified part of unspecified bronchus or lung: Secondary | ICD-10-CM

## 2020-03-25 DIAGNOSIS — Z515 Encounter for palliative care: Secondary | ICD-10-CM

## 2020-03-25 NOTE — Progress Notes (Signed)
[facility] Consult Note Telephone: [phone]  Fax: [phone]  PATIENT NAME: Gary [name] DOB: [birth year] MRN: [id number]  PRIMARY CARE PROVIDER:   Leone Haven, MD  REFERRING PROVIDER:  Leone Haven, MD [address] [place],  [place] [address]  RESPONSIBLE PARTY:  Wife, Duell Holdren [phone] Jonn Chaikin, son  [phone]    RECOMMENDATIONS and PLAN:  1.  Advanced care planning.  Patient is a DNR.  2.  Recurrent lung cancer with mets.  He has just finished radiation treatment and is not a candidate for chemo.  From Dr. Aletha Halim note, hospice has been discussed once radiation treatment is done.  Family states that he is getting weaker and is requiring more assistance with ADLs. Over the weekend he has starting sleeping more and today has been in bed all day.  He is not eating much and is starting to having problems with swallowing liquids and solids per family.  Wife states that he would not want to go through testing at GI to see what is wrong.  Family unsure how much weight he has lost but states that he used to weigh over 200 pounds at baseline approximately 6 months ago and now weighs around 180 pounds.  States that when he eats he only eats a few bites and sometimes is not able to keep all of that down.  Discussed hospice services.Denies SOB, chest pain, headaches, dizziness, constipation. Patient and family wish to pursue hospice at this time.  Hospice physician in agreement with hospice eligibility.  Will reach out to PCP for hospice referral.    3.  Pain. Patient does have pain in hips related to a fall about a year ago.  Does have oxycodone 5 mg Q4 hours PRN for pain. He needs to take about 2 oxy per day.  Today has not had any oxy and states not having pain at this time.  Continue current pain regimen.  I spent 110 minutes providing this consultation,  from 3:30 to 5:20 including time spent with  patient/family, chart review, provider coordination, documentation. More than 50% of the time in this consultation was spent coordinating communication.   HISTORY OF PRESENT ILLNESS:  Gary [name] is a 86 y.o. year old male with multiple medical problems including recurrent lung cancer with mets, a-fib, HTN, HLD. Palliative Care was asked to help address goals of care.   CODE STATUS: DNR  PPS: 40% HOSPICE ELIGIBILITY/DIAGNOSIS: TBD  PHYSICAL EXAM:  BP  128/70  HR 77  O2 98% on RA General: NAD, frail appearing, thin Cardiovascular: regular rate and rhythm Pulmonary: lung sounds clear; normal respiratory effort Abdomen: soft, nontender, + bowel sounds GU: no suprapubic tenderness Extremities: no edema, no joint deformities Skin: no rashes on exposed skin Neurological: Weakness but otherwise nonfocal   PAST MEDICAL HISTORY:  Past Medical History:  Diagnosis Date  . Atrial fibrillation ([facility])   . AV block    s/p Pacemaker placement.  . [facility])    being treated with radiation for lung cancer  . Chicken pox   . History of blood transfusion    patient and wife do not recall ever receiving a blood transfusion  . Hyperlipidemia   . Hypertension   . Presence of permanent cardiac pacemaker     SOCIAL HX:  Social History   Tobacco Use  . Smoking status: Current Some Day Smoker    Packs/day: 0.25    Years: 40.00  Pack years: 10.00    Types: Cigarettes  . Smokeless tobacco: Never Used  . Tobacco comment: trying to quit before surgery. smokes 6-7 a day  Substance Use Topics  . Alcohol use: Never    Alcohol/week: 0.0 standard drinks    ALLERGIES: No Known Allergies   PERTINENT MEDICATIONS:  Outpatient Encounter Medications as of 03/25/2020  Medication Sig  . Acetaminophen (TYLENOL PO) Take 500 mg by mouth. As needed for pain every 6 hours  . alendronate (FOSAMAX) 70 MG tablet Take 1 tablet (70 mg total) by mouth every 7 (seven) days. Take with a full glass of water on an  empty stomach. (Patient not taking: Reported on 03/16/2020)  . apixaban (ELIQUIS) 2.5 MG TABS tablet Take 2.5 mg by mouth 2 (two) times daily.   . cholecalciferol (VITAMIN D3) 25 MCG (1000 UT) tablet Take 5,000 Units by mouth daily.   Marland Kitchen dexamethasone (DECADRON) 2 MG tablet Take 1 tablet (2 mg total) by mouth 2 (two) times daily. (Patient not taking: Reported on 03/16/2020)  . ibuprofen (ADVIL) 100 MG/5ML suspension Take 200 mg by mouth. As needed for pain  . Multiple Vitamins-Minerals (PRESERVISION AREDS 2) CAPS Take 1 tablet by mouth 2 (two) times daily.  . naloxone (NARCAN) nasal spray 4 mg/0.1 mL 1 spray into nostril upon signs of opioid overdose (unresponsiveness). Call 911. May repeat once if no response within 2-3 minutes. (Patient not taking: Reported on 03/16/2020)  . ondansetron (ZOFRAN) 4 MG tablet Take 1 tablet (4 mg total) by mouth every 8 (eight) hours as needed for nausea or vomiting. (Patient not taking: Reported on 03/16/2020)  . oxyCODONE (OXY IR/ROXICODONE) 5 MG immediate release tablet Take 1 tablet (5 mg total) by mouth every 4 (four) hours as needed for severe pain.   No facility-administered encounter medications on file as of 03/25/2020.     Allon Costlow Jenetta Downer, NP

## 2020-03-26 ENCOUNTER — Telehealth: Payer: Self-pay | Admitting: Family Medicine

## 2020-03-26 DIAGNOSIS — R918 Other nonspecific abnormal finding of lung field: Secondary | ICD-10-CM

## 2020-03-26 NOTE — Telephone Encounter (Signed)
I have placed the referral. I can serve as the attending though I would like hospice to manage all symptomatic and comfort medications.

## 2020-03-26 NOTE — Telephone Encounter (Signed)
I called and spoke with Amy of [facility] and informed her that the referral for hospice was placed and that the provider stated he would be the attending for the patient but he would like for hospice to manage all symptomatic and comfort medications she stated that would be fine and she would inform hospice.  Skylor Hughson,cma

## 2020-03-26 NOTE — Telephone Encounter (Signed)
Gary [name] with [facility] called and states that the pt's health is declining and family would like to bring in hospice. She wanted to know if you could send referral and if you will still be the attending physician. Please advise [phone]

## 2020-03-26 NOTE — Telephone Encounter (Signed)
Gary [name] with [facility] called and states that the pt's health is declining and family would like to bring in hospice. She wanted to know if you could send referral and if you will still be the attending physician. Please advise [phone].  Rion Catala,cma

## 2020-03-30 ENCOUNTER — Inpatient Hospital Stay: Payer: Medicare Other | Attending: Hospice and Palliative Medicine | Admitting: Hospice and Palliative Medicine

## 2020-03-30 DIAGNOSIS — R918 Other nonspecific abnormal finding of lung field: Secondary | ICD-10-CM

## 2020-03-30 NOTE — Progress Notes (Signed)
Virtual Visit via Telephone Note  I connected with Gary [name] on 03/30/20 at  1:00 PM EDT by telephone and verified that I am speaking with the correct person using two identifiers.   I discussed the limitations, risks, security and privacy concerns of performing an evaluation and management service by telephone and the availability of in person appointments. I also discussed with the patient that there may be a patient responsible charge related to this service. The patient expressed understanding and agreed to proceed.   History of Present Illness: Palliative Care consult requested for this 86 y.o. male with multiple medical problems including non-small cell lung cancer right lower lobe, ILD, and CAD.  He has a recent history of an L1 compression fracture following a fall and is status post kyphoplasty.  Patient was deemed not to be a surgical candidate for his lung cancer and he received RT.  PET scan in April 2021 was concerning for recurrence with progression of right upper lobe lesion and hilar adenopathy.  He was also found to have hypermetabolic F75 lesion.  Patient has had severe pain from his compression fracture.  Patient was felt not to be a good candidate for systemic treatment.  Palliative care was referred to discuss goals and manage ongoing symptoms.   Observations/Objective: Called spoke with patient wife.  Wife reports the patient is doing well.  She denies any significant changes or concerns.  Patient was admitted to hospice services with [facility] last week.  No symptomatic concerns today.  Encouraged wife to call us with any changes or concerns.  Assessment and Plan: Non-small cell lung cancer -currently under hospice care  Follow Up Instructions: RTC as needed   I discussed the assessment and treatment plan with the patient. The patient was provided an opportunity to ask questions and all were answered. The patient agreed with the plan and demonstrated an understanding of  the instructions.   The patient was advised to call back or seek an in-person evaluation if the symptoms worsen or if the condition fails to improve as anticipated.  I provided 5 minutes of non-face-to-face time during this encounter.   Irean Hong, NP

## 2020-05-01 ENCOUNTER — Ambulatory Visit
Admission: RE | Admit: 2020-05-01 | Discharge: 2020-05-01 | Disposition: A | Discharge status: Home / Self Care | Source: Ambulatory Visit | Attending: Radiation Oncology | Admitting: Radiation Oncology

## 2020-05-01 ENCOUNTER — Other Ambulatory Visit: Payer: Self-pay | Admitting: *Deleted

## 2020-05-01 ENCOUNTER — Other Ambulatory Visit: Payer: Self-pay

## 2020-05-01 ENCOUNTER — Telehealth: Payer: Self-pay | Admitting: *Deleted

## 2020-05-01 DIAGNOSIS — F17210 Nicotine dependence, cigarettes, uncomplicated: Secondary | ICD-10-CM | POA: Insufficient documentation

## 2020-05-01 DIAGNOSIS — C7951 Secondary malignant neoplasm of bone: Secondary | ICD-10-CM | POA: Diagnosis not present

## 2020-05-01 DIAGNOSIS — C3411 Malignant neoplasm of upper lobe, right bronchus or lung: Secondary | ICD-10-CM | POA: Diagnosis not present

## 2020-05-01 DIAGNOSIS — Z923 Personal history of irradiation: Secondary | ICD-10-CM | POA: Insufficient documentation

## 2020-05-01 DIAGNOSIS — R918 Other nonspecific abnormal finding of lung field: Secondary | ICD-10-CM

## 2020-05-01 NOTE — Telephone Encounter (Signed)
Per Dr. Rogue Bussing. Arranged for patient to follow-up with Dr. Rogue Bussing and labs- cbc/metc/ldh on 05/14/2020.

## 2020-05-01 NOTE — Progress Notes (Signed)
Radiation Oncology Follow up Note  Name: Gary [name]   Date:   05/01/2020 MRN:  [id number] DOB: 1930/08/15    This 86 y.o. male presents to the clinic today for 1 month follow-up status post palliative radiation therapy to both his right upper lobe as well as spine for stage IV non-small cell lung cancer.  REFERRING PROVIDER: Leone Haven, MD  HPI: Patient is an 86 year old male now seen at 1 month having completed palliative radiation therapy to his.  Right upper lobe as well as T11 vertebral body for stage IV non-small cell lung cancer.  Seen today in routine follow-up he is doing fairly well he states his pain has improved somewhat although is legs are weak and he is only ambulating with a walker.  He specifically [place] any dysphagia cough or hemoptysis.  Patient is currently under hospice care.  COMPLICATIONS OF TREATMENT: none  FOLLOW UP COMPLIANCE: keeps appointments   PHYSICAL EXAM:  BP (P) 119/68 (BP Location: Left Arm, Patient Position: Sitting)    Pulse (P) 68    Wt (P) 169 lb 6.4 oz (76.8 kg)    BMI (P) 22.05 kg/m  Wheelchair-bound male in NAD.  Motor and sensory level in lower extremities appear equal and symmetric.  No pain is elicited on deep palpation of his spine.  Patient is wheelchair-bound well-developed well-nourished patient in NAD. HEENT reveals PERLA, EOMI, discs not visualized.  Oral cavity is clear. No oral mucosal lesions are identified. Neck is clear without evidence of cervical or supraclavicular adenopathy. Lungs are clear to A&P. Cardiac examination is essentially unremarkable with regular rate and rhythm without murmur rub or thrill. Abdomen is benign with no organomegaly or masses noted. Motor sensory and DTR levels are equal and symmetric in the upper and lower extremities. Cranial nerves II through XII are grossly intact. Proprioception is intact. No peripheral adenopathy or edema is identified. No motor or sensory levels are noted. Crude visual fields are  within normal range.  RADIOLOGY RESULTS: No current films to review  PLAN: Present time patient is under hospice care we will arrange follow-up with palliative care.  I also may recommend physical therapy if that is appropriate.  I have otherwise asked to see the patient back in 4 months for follow-up patient and family know to call with any concerns at any time.  I would like to take this opportunity to thank you for allowing me to participate in the care of your patient.Noreene Filbert, MD

## 2020-05-14 ENCOUNTER — Telehealth: Payer: Self-pay | Admitting: Hospice and Palliative Medicine

## 2020-05-14 ENCOUNTER — Inpatient Hospital Stay: Payer: Medicare Other

## 2020-05-14 ENCOUNTER — Inpatient Hospital Stay: Payer: Medicare Other | Admitting: Internal Medicine

## 2020-05-14 DIAGNOSIS — C3431 Malignant neoplasm of lower lobe, right bronchus or lung: Secondary | ICD-10-CM | POA: Insufficient documentation

## 2020-05-14 NOTE — Telephone Encounter (Signed)
Patient was scheduled to see Dr. Rogue Bussing today but son called into the clinic to report the patient was too weak to come.  I called and spoke with patient's son, Selinda Flavin.  Patient is being followed at home by hospice.  Son said he was comfortable with canceling the clinic visit and deferring care to hospice for now.  He agreed to call in to the clinic as needed if there are any changes.  I also called and spoke with patient's hospice nurse, [place].

## 2020-05-14 NOTE — Assessment & Plan Note (Deleted)
#   Right lower lobe lung mass 2.4 cm in size/indeterminate mediastinal/hilar adenopathy-highly concerning for malignancy [NO Biopsy]; s/p radiation.  April 2021-PET scan shows concern for recurrence; right upper lobe lesion; hilar/-adenopathy; also T11 uptake.  # Currently on RT; poor pain controlled [see below].  I had a long discussion with patient/son regarding the overall poor prognosis given progressive malignancy.  Discussed that patient will be a good candidate for hospice-if this condition continues to decline.  For now I think is reasonable to continue radiation for palliative reasons.  # Bone mets T11-symptomatic.  Worse continue radiation.  Discussed regarding additional steroids; narcotics.  Recommend titrating of narcotic pain medication/recent dexamethasone.  Awaiting evaluation with palliative care.  Discussed with Josh.  # Lung fibrosis/ILD-chronic STABLE.   # History of A. fib on Eliquis; status post permanent pacemaker.  Stable  Overall prognosis: Extremely poor; as discussed above-discussed with patient's son; patient would qualify for hospice.  Would recommend hospice referral after patient radiation.  Discussed with Josh Borders  Disposition:  #Follow-up-MD as needed/follow-up palliative care.

## 2020-05-18 ENCOUNTER — Ambulatory Visit: Payer: Medicare Other

## 2020-06-22 ENCOUNTER — Ambulatory Visit
Admission: RE | Admit: 2020-06-22 | Discharge: 2020-06-22 | Disposition: A | Discharge status: Home / Self Care | Payer: Medicare Other | Source: Ambulatory Visit | Attending: Radiation Oncology | Admitting: Radiation Oncology

## 2020-06-22 ENCOUNTER — Other Ambulatory Visit: Payer: Self-pay

## 2020-06-22 ENCOUNTER — Encounter: Payer: Self-pay | Admitting: Radiation Oncology

## 2020-06-22 VITALS — BP 98/59 | HR 59 | Temp 97.2°F | Resp 20 | Wt 164.2 lb

## 2020-06-22 DIAGNOSIS — C3411 Malignant neoplasm of upper lobe, right bronchus or lung: Secondary | ICD-10-CM | POA: Insufficient documentation

## 2020-06-22 DIAGNOSIS — C7951 Secondary malignant neoplasm of bone: Secondary | ICD-10-CM | POA: Insufficient documentation

## 2020-06-22 DIAGNOSIS — R079 Chest pain, unspecified: Secondary | ICD-10-CM | POA: Insufficient documentation

## 2020-06-22 DIAGNOSIS — C3431 Malignant neoplasm of lower lobe, right bronchus or lung: Secondary | ICD-10-CM

## 2020-06-22 DIAGNOSIS — M25551 Pain in right hip: Secondary | ICD-10-CM | POA: Insufficient documentation

## 2020-06-22 DIAGNOSIS — M25552 Pain in left hip: Secondary | ICD-10-CM | POA: Insufficient documentation

## 2020-06-22 NOTE — Progress Notes (Signed)
Radiation Oncology Follow up Note  Name: Gary [name]   Date:   06/22/2020 MRN:  [id number] DOB: [birth year]    This 86 y.o. male presents to the clinic today for self referral patient experiencing chest pain last week and patient with known stage IV non-small cell lung cancer previously treated for spinal mets.  REFERRING PROVIDER: Leone Haven, MD  HPI: Patient is an 86 year old male well-known to our department requested to be seen for chest pain which he was experiencing last week.  He is currently not under at home hospice care.  He was chest pain has cleared he is also complaining of some bilateral hip pain.  He does have a lesion at the L5 vertebral body on his last PET scan not certain that that is the etiology of his pain at the present time..  COMPLICATIONS OF TREATMENT: none  FOLLOW UP COMPLIANCE: keeps appointments   PHYSICAL EXAM:  BP (!) 98/59 (BP Location: Right Arm, Patient Position: Sitting, Cuff Size: Normal)   Pulse (!) 59   Temp (!) 97.2 F (36.2 C) (Tympanic)   Resp 20   Wt 164 lb 3.2 oz (74.5 kg)   BMI 21.37 kg/m  Range of motion of the lower extremities did not elicit pain.  Well-developed well-nourished patient in NAD. HEENT reveals PERLA, EOMI, discs not visualized.  Oral cavity is clear. No oral mucosal lesions are identified. Neck is clear without evidence of cervical or supraclavicular adenopathy. Lungs are clear to A&P. Cardiac examination is essentially unremarkable with regular rate and rhythm without murmur rub or thrill. Abdomen is benign with no organomegaly or masses noted. Motor sensory and DTR levels are equal and symmetric in the upper and lower extremities. Cranial nerves II through XII are grossly intact. Proprioception is intact. No peripheral adenopathy or edema is identified. No motor or sensory levels are noted. Crude visual fields are within normal range.  RADIOLOGY RESULTS: Previous PET CT scan reviewed compatible with above-stated  findings  PLAN: At this time I am certainly not definitive that his L-spine lesion is causing him pain at this time not sure the etiology of his chest pain he is under hospice home care of asked them to alert the staff of his pain issues and to titrate his pain medication to alleviate some of this discomfort.  I certainly would not offer palliative radiation therapy at this time while he is under hospice care.  Family and patient know to call me with any concerns.  I would like to take this opportunity to thank you for allowing me to participate in the care of your patient.Noreene Filbert, MD

## 2020-07-02 ENCOUNTER — Telehealth: Payer: Self-pay

## 2020-07-02 NOTE — Telephone Encounter (Signed)
Hospice nurse called and stated the patient was complaining of hip pain and tramadol is not helping, also the patient had oxycodone in the home but it had expired she was called for a new Rx for Oxycodone and I informed her that the provider was on vacation and would not be back until Monday, she understood and stated she would ask the hospice provider to write the RX.  Sharlize Hoar,cma

## 2020-07-28 ENCOUNTER — Telehealth: Payer: Self-pay | Admitting: Family Medicine

## 2020-07-28 NOTE — Telephone Encounter (Signed)
[facility] called

## 2020-07-28 NOTE — Telephone Encounter (Signed)
Gary [name] called from hospice and stated that the patient was on oxycodone 5 mg and they would like to switch him to mscotin eqaul to 10 mg BID, he needs a prescription sent to [facility] on [address] and he also needs a refill on the lorazapam 0.5 mg taking BID for agitation he has a hospital bed  and he also has 24 hour care starting tomorrow. They said he needed oxygen but they are holding off because the patient is still smoking. All they need is the medication sent to his pharmacy.  Gary [name],cma

## 2020-07-28 NOTE — Telephone Encounter (Addendum)
I have not been managing his pain or other symptomatic medications while he is been on hospice.  I always leave this up to the hospice physician as they have the expertise to manage end-of-life pain and other symptoms.  I would prefer for hospice to manage his pain medication.

## 2020-07-29 NOTE — Telephone Encounter (Signed)
I called and spoke with Gary [name] and informed her that the provider stated he preferred if the hospice provider dealt with the patients pain medication.  Gary [name],cma

## 2020-08-11 ENCOUNTER — Telehealth: Payer: Self-pay | Admitting: Family Medicine

## 2020-08-12 ENCOUNTER — Telehealth: Payer: Self-pay

## 2020-08-12 NOTE — Telephone Encounter (Signed)
Signed. Please fill in the rest of our information and make it available for pick up.

## 2020-08-12 NOTE — Telephone Encounter (Signed)
Called to [employer] [facility] and informed the person that answered that the death certificate was ready for pickup at the front desk.  Gary [name],cma

## 2020-08-12 NOTE — Telephone Encounter (Signed)
I need a time and date of death to complete this. Was he on hospice? If so they should be able to tell us this. If not we will need to contact the family to determine this.

## 2020-08-12 NOTE — Telephone Encounter (Signed)
I called and spoke with someone with Hospice and the date of death was 08-12-2020 @ 7:31 am.  Jamaiya Tunnell,cma

## 2020-08-12 NOTE — Telephone Encounter (Signed)
Death Certificate placed in sign basket.  Duell Holdren,cma

## 2020-08-12 NOTE — Telephone Encounter (Signed)
Ande Shoe with [employer] came in and dropped of death certificate to be filled out. He asked for this to be done ASAP. Placed in folder up front. Please call [phone] when complete

## 2020-08-24 NOTE — Telephone Encounter (Signed)
Hospice called to report that patient passed away today at 7:31am

## 2020-08-24 DEATH — deceased

## 2020-09-03 ENCOUNTER — Ambulatory Visit: Payer: Medicare Other | Admitting: Radiation Oncology
